# Patient Record
Sex: Female | Born: 1990 | Hispanic: No | Marital: Single | Smoking: Former smoker
Health system: Southern US, Community
[De-identification: ages and names within clinical notes are randomized; demographics above are authoritative.]

## PROBLEM LIST (undated history)

## (undated) DIAGNOSIS — I10 Essential (primary) hypertension: Secondary | ICD-10-CM

## (undated) DIAGNOSIS — D219 Benign neoplasm of connective and other soft tissue, unspecified: Secondary | ICD-10-CM

## (undated) HISTORY — DX: Benign neoplasm of connective and other soft tissue, unspecified: D21.9

## (undated) HISTORY — PX: BREAST SURGERY: SHX581

## (undated) HISTORY — PX: TONSILLECTOMY: SUR1361

## (undated) HISTORY — DX: Essential (primary) hypertension: I10

---
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
## 2021-03-20 ENCOUNTER — Encounter (HOSPITAL_COMMUNITY): Payer: Self-pay | Admitting: Emergency Medicine

## 2021-03-20 ENCOUNTER — Other Ambulatory Visit: Payer: Self-pay

## 2021-03-20 ENCOUNTER — Emergency Department (HOSPITAL_COMMUNITY)
Admission: EM | Admit: 2021-03-20 | Discharge: 2021-03-20 | Disposition: A | Discharge status: Home / Self Care | Payer: Medicaid Other | Attending: Emergency Medicine | Admitting: Emergency Medicine

## 2021-03-20 DIAGNOSIS — Z20822 Contact with and (suspected) exposure to covid-19: Secondary | ICD-10-CM | POA: Insufficient documentation

## 2021-03-20 DIAGNOSIS — R059 Cough, unspecified: Secondary | ICD-10-CM | POA: Diagnosis present

## 2021-03-20 DIAGNOSIS — J029 Acute pharyngitis, unspecified: Secondary | ICD-10-CM | POA: Diagnosis not present

## 2021-03-20 DIAGNOSIS — J069 Acute upper respiratory infection, unspecified: Secondary | ICD-10-CM | POA: Diagnosis not present

## 2021-03-20 LAB — GROUP A STREP BY PCR: Group A Strep by PCR: NOT DETECTED

## 2021-03-20 LAB — RESP PANEL BY RT-PCR (FLU A&B, COVID) ARPGX2
Influenza A by PCR: NEGATIVE
Influenza B by PCR: NEGATIVE
SARS Coronavirus 2 by RT PCR: NEGATIVE

## 2021-03-20 MED ORDER — IBUPROFEN 400 MG PO TABS
600.0000 mg | ORAL_TABLET | Freq: Once | ORAL | Status: AC
  Administered 2021-03-20: 600 mg via ORAL
  Filled 2021-03-20: qty 1

## 2021-03-20 NOTE — ED Provider Notes (Signed)
[place] EMERGENCY DEPARTMENT Provider Note   CSN: [id number] Arrival date & time: 03/20/21  0719     History Chief Complaint  Patient presents with  . Cough    Christina May is a 30 y.o. female.  Patient presents with hoarse cough since Saturday, and children with similar symptoms.  No fevers or chills.  No vomiting.  Tolerating oral liquids.  No active medical problems or lung disease.  Mild sore throat.        History reviewed. No pertinent past medical history.  There are no problems to display for this patient.   History reviewed. No pertinent surgical history.   OB History   No obstetric history on file.     No family history on file.  Social History   Tobacco Use  . Smoking status: Never Smoker  . Smokeless tobacco: Never Used    Home Medications Prior to Admission medications   Not on File    Allergies    Patient has no known allergies.  Review of Systems   Review of Systems  Constitutional: Negative for chills and fever.  HENT: Positive for congestion.   Eyes: Negative for visual disturbance.  Respiratory: Positive for cough. Negative for shortness of breath.   Cardiovascular: Negative for chest pain.  Gastrointestinal: Negative for abdominal pain and vomiting.  Genitourinary: Negative for flank pain.  Musculoskeletal: Negative for back pain, neck pain and neck stiffness.  Skin: Negative for rash.  Neurological: Negative for light-headedness and headaches.    Physical Exam Updated Vital Signs BP 126/83 (BP Location: Right Arm)   Pulse 89   Temp 97.7 F (36.5 C) (Temporal)   Resp 16   SpO2 99%   Physical Exam Vitals and nursing note reviewed.  Constitutional:      Appearance: She is well-developed.  HENT:     Head: Normocephalic and atraumatic.     Nose: Congestion and rhinorrhea present.     Mouth/Throat:     Pharynx: Posterior oropharyngeal erythema present. No oropharyngeal exudate.  Eyes:     General:         Right eye: No discharge.        Left eye: No discharge.     Conjunctiva/sclera: Conjunctivae normal.  Neck:     Trachea: No tracheal deviation.  Cardiovascular:     Rate and Rhythm: Normal rate and regular rhythm.  Pulmonary:     Effort: Pulmonary effort is normal.     Breath sounds: Normal breath sounds.  Abdominal:     General: There is no distension.     Palpations: Abdomen is soft.     Tenderness: There is no abdominal tenderness. There is no guarding.  Musculoskeletal:     Cervical back: Normal range of motion and neck supple. No rigidity.  Skin:    General: Skin is warm.     Findings: No rash.  Neurological:     General: No focal deficit present.     Mental Status: She is alert.  Psychiatric:        Mood and Affect: Mood normal.     ED Results / Procedures / Treatments   Labs (all labs ordered are listed, but only abnormal results are displayed) Labs Reviewed  GROUP A STREP BY PCR  RESP PANEL BY RT-PCR (FLU A&B, COVID) ARPGX2    EKG None  Radiology No results found.  Procedures Procedures   Medications Ordered in ED Medications  ibuprofen (ADVIL) tablet 600 mg (600 mg Oral  Given 03/20/21 0804)    ED Course  I have reviewed the triage vital signs and the nursing notes.  Pertinent labs & imaging results that were available during my care of the patient were reviewed by me and considered in my medical decision making (see chart for details).    MDM Rules/Calculators/A&P                          Patient presents with clinical concern for upper respiratory infection/viral infection however with persistent sore throat and children with similar symptoms plan add strep test to viral testing.  Supportive care and outpatient follow-up discussed.  Mother has children with her and will follow up results on MyChart. Patient has normal work of breathing, normal vital signs.  Christina May was evaluated in Emergency Department on 03/20/2021 for the symptoms  described in the history of present illness. She was evaluated in the context of the global COVID-19 pandemic, which necessitated consideration that the patient might be at risk for infection with the SARS-CoV-2 virus that causes COVID-19. Institutional protocols and algorithms that pertain to the evaluation of patients at risk for COVID-19 are in a state of rapid change based on information released by regulatory bodies including the CDC and federal and state organizations. These policies and algorithms were followed during the patient's care in the ED.  Final Clinical Impression(s) / ED Diagnoses Final diagnoses:  Acute pharyngitis, unspecified etiology  Acute upper respiratory infection    Rx / DC Orders ED Discharge Orders    None       Elnora Morrison, MD 03/20/21 [phone]

## 2021-03-20 NOTE — ED Triage Notes (Signed)
Deep cough since Saturday. No fever. Kids are sick as well.

## 2021-03-20 NOTE — Discharge Instructions (Signed)
Follow-up testing results on MyChart.  If strep test is positive you will either need the pediatric ER or your primary doctor to call in oral antibiotics.  If the strep test is negative still continue supportive care of ibuprofen, Tylenol and oral fluids.  Follow-up viral test results as well later this afternoon. Return for breathing difficulties or new concerns.

## 2021-03-22 ENCOUNTER — Other Ambulatory Visit: Payer: Self-pay

## 2021-03-22 ENCOUNTER — Emergency Department (HOSPITAL_COMMUNITY)
Admission: EM | Admit: 2021-03-22 | Discharge: 2021-03-22 | Disposition: A | Discharge status: Home / Self Care | Payer: Medicaid Other | Attending: Emergency Medicine | Admitting: Emergency Medicine

## 2021-03-22 ENCOUNTER — Encounter (HOSPITAL_COMMUNITY): Payer: Self-pay | Admitting: Emergency Medicine

## 2021-03-22 DIAGNOSIS — J029 Acute pharyngitis, unspecified: Secondary | ICD-10-CM | POA: Insufficient documentation

## 2021-03-22 DIAGNOSIS — K122 Cellulitis and abscess of mouth: Secondary | ICD-10-CM | POA: Insufficient documentation

## 2021-03-22 DIAGNOSIS — Z20822 Contact with and (suspected) exposure to covid-19: Secondary | ICD-10-CM | POA: Insufficient documentation

## 2021-03-22 LAB — RESP PANEL BY RT-PCR (FLU A&B, COVID) ARPGX2
Influenza A by PCR: NEGATIVE
Influenza B by PCR: NEGATIVE
SARS Coronavirus 2 by RT PCR: NEGATIVE

## 2021-03-22 LAB — GROUP A STREP BY PCR: Group A Strep by PCR: NOT DETECTED

## 2021-03-22 MED ORDER — CLINDAMYCIN HCL 150 MG PO CAPS: 300.0000 mg | ORAL_CAPSULE | Freq: Three times a day (TID) | ORAL | 0 refills | Status: DC

## 2021-03-22 NOTE — ED Provider Notes (Signed)
[place] EMERGENCY DEPARTMENT Provider Note   CSN: [id number] Arrival date & time: 03/22/21  0347     History Chief Complaint  Patient presents with   Sore Throat    Christina May is a 30 y.o. female.  Patient presents to the emergency department with a chief complaint of sore throat and cough.  Reports having had the symptoms for the past 3 to 4 days.  She states that she was seen here on Wednesday and had negative COVID and strep.  These tests were repeated in triage, and are negative again tonight.  Patient denies having had any fevers.  She reports that she is taking over-the-counter cough and cold medications with little relief.  She states that she has been tolerating fluids, but it is painful to swallow.  The history is provided by the patient. No language interpreter was used.      History reviewed. No pertinent past medical history.  There are no problems to display for this patient.   History reviewed. No pertinent surgical history.   OB History   No obstetric history on file.     History reviewed. No pertinent family history.  Social History   Tobacco Use   Smoking status: Never   Smokeless tobacco: Never    Home Medications Prior to Admission medications   Not on File    Allergies    Patient has no known allergies.  Review of Systems   Review of Systems  All other systems reviewed and are negative.  Physical Exam Updated Vital Signs BP 119/89 (BP Location: Left Arm)   Pulse 85   Temp 98.4 F (36.9 C) (Oral)   Resp 20   Ht 5\' 5"  (1.651 m)   Wt 91.2 kg   LMP 03/15/2021   SpO2 98%   BMI 33.45 kg/m   Physical Exam Vitals and nursing note reviewed.  Constitutional:      General: She is not in acute distress.    Appearance: She is well-developed.  HENT:     Head: Normocephalic and atraumatic.     Mouth/Throat:     Comments: Oropharynx mildly erythematous, uvula is erythematous and mildly edematous, no tonsillar  exudate or abscess, normal voice, no stridor Eyes:     Conjunctiva/sclera: Conjunctivae normal.  Cardiovascular:     Rate and Rhythm: Normal rate.     Heart sounds: No murmur heard. Pulmonary:     Effort: Pulmonary effort is normal. No respiratory distress.  Abdominal:     General: There is no distension.  Musculoskeletal:     Cervical back: Neck supple.     Comments: Moves all extremities  Skin:    General: Skin is warm and dry.  Neurological:     Mental Status: She is alert and oriented to person, place, and time.  Psychiatric:        Mood and Affect: Mood normal.        Behavior: Behavior normal.    ED Results / Procedures / Treatments   Labs (all labs ordered are listed, but only abnormal results are displayed) Labs Reviewed  GROUP A STREP BY PCR  RESP PANEL BY RT-PCR (FLU A&B, COVID) ARPGX2    EKG None  Radiology No results found.  Procedures Procedures   Medications Ordered in ED Medications - No data to display  ED Course  I have reviewed the triage vital signs and the nursing notes.  Pertinent labs & imaging results that were available during my care of  the patient were reviewed by me and considered in my medical decision making (see chart for details).    MDM Rules/Calculators/A&P                          Patient here with sore throat and cough.  COVID and strep test are negative.  There is a moderate amount of swelling and erythema about the uvula, consider uvulitis.  I discussed with the patient that her symptoms are likely viral, but given the worsening and persistent symptoms and swollen uvula, will trial clindamycin. Final Clinical Impression(s) / ED Diagnoses Final diagnoses:  Sore throat  Uvulitis    Rx / DC Orders ED Discharge Orders          Ordered    clindamycin (CLEOCIN) 150 MG capsule  3 times daily        03/22/21 0513             Montine Circle, PA-C 03/22/21 0513    Maudie Flakes, MD 03/22/21 [phone]

## 2021-03-22 NOTE — ED Notes (Signed)
Patient verbalizes understanding of discharge instructions. Prescriptions reviewed. Opportunity for questioning and answers were provided. Armband removed by staff, pt discharged from ED ambulatory.

## 2021-03-22 NOTE — ED Triage Notes (Signed)
Pt c/o sore throat x3 days, with painful swallowing along with bilateral ear aches. Has negative COVID/Strep test on Wednesday. Denies CP/SOB. Has been tolerating fluids.

## 2021-05-21 LAB — HEPATITIS C ANTIBODY: HCV Ab: NEGATIVE

## 2021-05-21 LAB — OB RESULTS CONSOLE RUBELLA ANTIBODY, IGM: Rubella: IMMUNE

## 2021-05-21 LAB — OB RESULTS CONSOLE HIV ANTIBODY (ROUTINE TESTING): HIV: NONREACTIVE

## 2021-05-21 LAB — OB RESULTS CONSOLE HEPATITIS B SURFACE ANTIGEN: Hepatitis B Surface Ag: NEGATIVE

## 2021-10-01 ENCOUNTER — Ambulatory Visit (INDEPENDENT_AMBULATORY_CARE_PROVIDER_SITE_OTHER): Payer: Medicaid Other | Admitting: Student

## 2021-10-01 ENCOUNTER — Encounter: Payer: Self-pay | Admitting: Student

## 2021-10-01 ENCOUNTER — Other Ambulatory Visit: Payer: Self-pay

## 2021-10-01 VITALS — BP 107/81 | HR 94 | Wt 191.9 lb

## 2021-10-01 DIAGNOSIS — O43113 Circumvallate placenta, third trimester: Secondary | ICD-10-CM | POA: Diagnosis not present

## 2021-10-01 DIAGNOSIS — Z8759 Personal history of other complications of pregnancy, childbirth and the puerperium: Secondary | ICD-10-CM | POA: Insufficient documentation

## 2021-10-01 DIAGNOSIS — Z3A33 33 weeks gestation of pregnancy: Secondary | ICD-10-CM | POA: Diagnosis not present

## 2021-10-01 DIAGNOSIS — Z3483 Encounter for supervision of other normal pregnancy, third trimester: Secondary | ICD-10-CM

## 2021-10-01 DIAGNOSIS — Z3490 Encounter for supervision of normal pregnancy, unspecified, unspecified trimester: Secondary | ICD-10-CM | POA: Insufficient documentation

## 2021-10-01 DIAGNOSIS — Z3493 Encounter for supervision of normal pregnancy, unspecified, third trimester: Secondary | ICD-10-CM

## 2021-10-01 NOTE — Progress Notes (Signed)
°  Subjective:    Christina May is being seen today for her first obstetrical visit.  This is not a planned pregnancy. She is at [redacted]w[redacted]d gestation. Her obstetrical history is significant for  history of high blood pressure in pregnancy. She says it was only elevated in pregnancy and they induced her for this. She denies  that she had pre-eclampsia. She denies any history of c/sections.  . Relationship with FOB: significant other, not living together.  He is living in [place]; may move up here they are not sure. Patient does intend to breast feed. Pregnancy history fully reviewed.  Patient reports no complaints.   Patient states that her OB was concerned about her placenta; concerned about the location of it. Otherwise she says that her pregnancy has been uncomplicated.   Review of Systems:   Review of Systems  Constitutional: Negative.   HENT: Negative.    Eyes: Negative.   Respiratory: Negative.    Cardiovascular: Negative.   Gastrointestinal: Negative.   Genitourinary: Negative.   Neurological: Negative.   Hematological: Negative.   Psychiatric/Behavioral: Negative.     Objective:     BP 107/81    Pulse 94    Wt 191 lb 14.4 oz (87 kg)    LMP 02/12/2021 (Exact Date)    BMI 31.93 kg/m  Physical Exam Constitutional:      Appearance: Normal appearance.  HENT:     Head: Normocephalic.  Cardiovascular:     Rate and Rhythm: Normal rate.  Pulmonary:     Effort: Pulmonary effort is normal.  Abdominal:     General: Abdomen is flat.  Musculoskeletal:        General: Normal range of motion.  Skin:    General: Skin is warm.     Capillary Refill: Capillary refill takes less than 2 seconds.  Neurological:     General: No focal deficit present.     Mental Status: She is alert.    Exam    Assessment:    Pregnancy: [id number] Patient Active Problem List   Diagnosis Date Noted   Supervision of low-risk pregnancy 10/01/2021       Plan:     Initial labs drawn. Prenatal  vitamins. Problem list reviewed and updated. AFP3 discussed:  too late . Role of ultrasound in pregnancy discussed; fetal survey: ordered. Amniocentesis discussed: not indicated. Follow up in 2 weeks. 75% of 30 min visit spent on counseling and coordination of care.  -planning for BTL; signed papers today -patient has social issues; she is unable to miss work starting January 23. The stress of losing her job is overwhelming to her. She is requesting IOL at 39 weeks on a Friday due to work. I explained that that we will work with her to make this happen as losing her job would be very detrimental to her health and well-being and that has multip this would be possible.  -reviewed labs from past appts; all normal -will draw baseline pre-e labs today -unable to review [place] in Epic, note says that placenta was circumvellate. Will order [place] today to check on placenta -welcomed patient to practice and discussed how the practice works; patient i Christina May 10/01/2021

## 2021-10-02 ENCOUNTER — Other Ambulatory Visit: Payer: Self-pay

## 2021-10-02 DIAGNOSIS — O43113 Circumvallate placenta, third trimester: Secondary | ICD-10-CM

## 2021-10-02 DIAGNOSIS — O43119 Circumvallate placenta, unspecified trimester: Secondary | ICD-10-CM | POA: Insufficient documentation

## 2021-10-02 LAB — PROTEIN / CREATININE RATIO, URINE
Creatinine, Urine: 141.4 mg/dL
Protein, Ur: 17.8 mg/dL
Protein/Creat Ratio: 126 mg/g creat (ref 0–200)

## 2021-10-02 LAB — COMPREHENSIVE METABOLIC PANEL
ALT: 7 IU/L (ref 0–32)
AST: 16 IU/L (ref 0–40)
Albumin/Globulin Ratio: 1 — ABNORMAL LOW (ref 1.2–2.2)
Albumin: 3.4 g/dL — ABNORMAL LOW (ref 3.9–5.0)
Alkaline Phosphatase: 210 IU/L — ABNORMAL HIGH (ref 44–121)
BUN/Creatinine Ratio: 13 (ref 9–23)
BUN: 5 mg/dL — ABNORMAL LOW (ref 6–20)
Bilirubin Total: 0.2 mg/dL (ref 0.0–1.2)
CO2: 20 mmol/L (ref 20–29)
Calcium: 9 mg/dL (ref 8.7–10.2)
Chloride: 104 mmol/L (ref 96–106)
Creatinine, Ser: 0.39 mg/dL — ABNORMAL LOW (ref 0.57–1.00)
Globulin, Total: 3.4 g/dL (ref 1.5–4.5)
Glucose: 88 mg/dL (ref 70–99)
Potassium: 4.2 mmol/L (ref 3.5–5.2)
Sodium: 136 mmol/L (ref 134–144)
Total Protein: 6.8 g/dL (ref 6.0–8.5)
eGFR: 139 mL/min/{1.73_m2} (ref >59)

## 2021-10-02 LAB — CBC
Hematocrit: 33.3 % — ABNORMAL LOW (ref 34.0–46.6)
Hemoglobin: 10.6 g/dL — ABNORMAL LOW (ref 11.1–15.9)
MCH: 24.1 pg — ABNORMAL LOW (ref 26.6–33.0)
MCHC: 31.8 g/dL (ref 31.5–35.7)
MCV: 76 fL — ABNORMAL LOW (ref 79–97)
Platelets: 160 10*3/uL (ref 150–450)
RBC: 4.39 x10E6/uL (ref 3.77–5.28)
RDW: 13.8 % (ref 11.7–15.4)
WBC: 11.9 10*3/uL — ABNORMAL HIGH (ref 3.4–10.8)

## 2021-10-09 ENCOUNTER — Ambulatory Visit: Payer: Medicaid Other | Admitting: *Deleted

## 2021-10-09 ENCOUNTER — Ambulatory Visit: Payer: Medicaid Other | Attending: Student

## 2021-10-09 ENCOUNTER — Other Ambulatory Visit: Payer: Self-pay

## 2021-10-09 ENCOUNTER — Encounter: Payer: Self-pay | Admitting: *Deleted

## 2021-10-09 VITALS — BP 123/80 | HR 93

## 2021-10-09 DIAGNOSIS — Z3493 Encounter for supervision of normal pregnancy, unspecified, third trimester: Secondary | ICD-10-CM | POA: Diagnosis present

## 2021-10-09 DIAGNOSIS — E669 Obesity, unspecified: Secondary | ICD-10-CM | POA: Diagnosis not present

## 2021-10-09 DIAGNOSIS — O99213 Obesity complicating pregnancy, third trimester: Secondary | ICD-10-CM

## 2021-10-09 DIAGNOSIS — O43103 Malformation of placenta, unspecified, third trimester: Secondary | ICD-10-CM | POA: Diagnosis not present

## 2021-10-09 DIAGNOSIS — O09293 Supervision of pregnancy with other poor reproductive or obstetric history, third trimester: Secondary | ICD-10-CM

## 2021-10-09 DIAGNOSIS — O43113 Circumvallate placenta, third trimester: Secondary | ICD-10-CM | POA: Diagnosis present

## 2021-10-09 DIAGNOSIS — Z3A34 34 weeks gestation of pregnancy: Secondary | ICD-10-CM | POA: Insufficient documentation

## 2021-10-13 ENCOUNTER — Encounter: Payer: Self-pay | Admitting: Student

## 2021-10-13 NOTE — L&D Delivery Note (Signed)
OB/GYN Faculty Practice Delivery Note  Christina May is a 31 y.o. [id number] s/p SVD at [redacted]w[redacted]d. She was admitted for IOL for gHTN.   ROM: 1h 23m with clear fluid GBS Status: negative Maximum Maternal Temperature: 98.8  Labor Progress: Presented for IOL, was 4 cm, started on pitocin and then 4 hours later was still 4 cm and AROMed and progressed to complete within an hour.  Delivery Date/Time: [phone] on 11/02/2021 Delivery: Called to room - patient felt intense rectal pressure with RN at bedside and her legs were lifted up baby's head was out. Head delivered LOA. Nuchal cord present and delivered through with shoulder and body delivered in usual fashion. Nuchal cord reduced after delivery. Infant with spontaneous cry, placed on mother's abdomen, dried and stimulated. Cord clamped x 2 after 1-minute delay, and cut by father of baby. Cord blood drawn. Placenta delivered spontaneously with gentle cord traction. Fundus firm with massage and Pitocin. Labia, perineum, vagina, and cervix inspected inspected and found to have bilateral periurethral lacerations that were repaired with 4-0 Monocryl in interrupted fashion.   Placenta: intact, 3V cord, L&D Complications: none Lacerations: periurethral EBL: 100cc Analgesia: none  Infant: female   APGARs 8,9   weight pending  Renard Matter, MD, MPH OB Fellow, [place] for [facility], [facility] 11/02/2021, 5:34 AM

## 2021-10-25 ENCOUNTER — Other Ambulatory Visit: Payer: Self-pay

## 2021-10-25 ENCOUNTER — Other Ambulatory Visit (HOSPITAL_COMMUNITY)
Admission: RE | Admit: 2021-10-25 | Discharge: 2021-10-25 | Disposition: A | Discharge status: Home / Self Care | Payer: Medicaid Other | Source: Ambulatory Visit

## 2021-10-25 ENCOUNTER — Ambulatory Visit (INDEPENDENT_AMBULATORY_CARE_PROVIDER_SITE_OTHER): Payer: Medicaid Other

## 2021-10-25 VITALS — BP 123/82 | HR 94 | Wt 198.7 lb

## 2021-10-25 DIAGNOSIS — Z8669 Personal history of other diseases of the nervous system and sense organs: Secondary | ICD-10-CM | POA: Diagnosis not present

## 2021-10-25 DIAGNOSIS — Z3A36 36 weeks gestation of pregnancy: Secondary | ICD-10-CM | POA: Diagnosis not present

## 2021-10-25 DIAGNOSIS — Z3490 Encounter for supervision of normal pregnancy, unspecified, unspecified trimester: Secondary | ICD-10-CM | POA: Diagnosis present

## 2021-10-25 LAB — OB RESULTS CONSOLE GC/CHLAMYDIA: Gonorrhea: NEGATIVE

## 2021-10-25 MED ORDER — BLOOD PRESSURE KIT DEVI: 1.0000 | Freq: Once | 0 refills | Status: AC

## 2021-10-25 MED ORDER — CYCLOBENZAPRINE HCL 10 MG PO TABS: 10.0000 mg | ORAL_TABLET | Freq: Three times a day (TID) | ORAL | 1 refills | Status: DC | PRN

## 2021-10-25 NOTE — Progress Notes (Signed)
° °  PRENATAL VISIT NOTE  Subjective:  Christina May is a 31 y.o. [phone] at 59w3dbeing seen today for ongoing prenatal care.  She is currently monitored for the following issues for this low-risk pregnancy and has Supervision of low-risk pregnancy; History of gestational hypertension; and Circumvallate placenta on their problem list.  Patient reports irregular contractions that started last night. Denies vaginal bleeding or leaking fluid. Endorses active fetal movement. Also reports headache that started yesterday. Took Tylenol but did not help. Has history of migraines, but has not been on any medication for migraines since prior to pregnancy. Pain is similar to migraines. Denies vision changes or RUQ pain. Reports she has not had much to eat/drink recently due to decreased appetite. Contractions: Irritability. Vag. Bleeding: None.  Movement: Present. Denies leaking of fluid.   The following portions of the patient's history were reviewed and updated as appropriate: allergies, current medications, past family history, past medical history, past social history, past surgical history and problem list.   Objective:   Vitals:   10/25/21 1011  BP: 123/82  Pulse: 94  Weight: 198 lb 11.2 oz (90.1 kg)    Fetal Status: Fetal Heart Rate (bpm): 132 Fundal Height: 37 cm Movement: Present     General:  Alert, oriented and cooperative. Patient is in no acute distress.  Skin: Skin is warm and dry. No rash noted.   Cardiovascular: Normal heart rate noted  Respiratory: Normal respiratory effort, no problems with respiration noted  Abdomen: Soft, gravid, appropriate for gestational age.  Pain/Pressure: Present     Pelvic: Cervical exam performed in the presence of a chaperone Dilation: 2.5 Effacement (%): Thick Station: Ballotable  Extremities: Normal range of motion.  Edema: None  Mental Status: Normal mood and affect. Normal behavior. Normal judgment and thought content.   Assessment and Plan:   Pregnancy: [id number] 379w3d. Encounter for supervision of low-risk pregnancy, antepartum - Routine OB care  - Cervical exam per patient request - Anticipatory guidance for upcoming appointments provided - Patient desires IOL on 2/4 due to work schedule. Starting a new job on 1/23 and cannot miss work. Work schedule will be Tuesday-Saturday 10a-6:30p. Will discuss more at next visit  - Blood Pressure Monitoring (BLOOD PRESSURE KIT) DEVI; 1 Device by Does not apply route once for 1 dose.  Dispense: 1 each; Refill: 0 - Cervicovaginal ancillary only( [place]) - Culture, beta strep (group b only)  2. Hx of migraines - BP normotensive. Will rx flexeril  - Encouraged patient to eat/drink in small, frequent amounts. May use Tylenol on scheduled basis.  - Strict return precautions reviewed at length  - cyclobenzaprine (FLEXERIL) 10 MG tablet; Take 1 tablet (10 mg total) by mouth every 8 (eight) hours as needed for muscle spasms.  Dispense: 30 tablet; Refill: 1  3. [redacted] weeks gestation of pregnancy   Preterm labor symptoms and general obstetric precautions including but not limited to vaginal bleeding, contractions, leaking of fluid and fetal movement were reviewed in detail with the patient. Please refer to After Visit Summary for other counseling recommendations.   Return in about 1 week (around 11/01/2021).  Future Appointments  Date Time Provider [place]1/20/2023  8:55 AM DuRadene GunningMD [facility] 10/25/21 11:35 AM

## 2021-10-25 NOTE — Progress Notes (Deleted)
° °  PRENATAL VISIT NOTE  Subjective:  Christina May is a 31 y.o. [phone] at 31w3dbeing seen today for ongoing prenatal care.  She is currently monitored for the following issues for this low-risk pregnancy and has Supervision of low-risk pregnancy; History of gestational hypertension; and Circumvallate placenta on their problem list.  Patient reports {[id number]}.  Contractions: Irritability. Vag. Bleeding: None.  Movement: Present. Denies leaking of fluid.   The following portions of the patient's history were reviewed and updated as appropriate: allergies, current medications, past family history, past medical history, past social history, past surgical history and problem list.   Objective:   Vitals:   10/25/21 1011  BP: 123/82  Pulse: 94  Weight: 198 lb 11.2 oz (90.1 kg)    Fetal Status: Fetal Heart Rate (bpm): 132 Fundal Height: 37 cm Movement: Present     General:  Alert, oriented and cooperative. Patient is in no acute distress.  Skin: Skin is warm and dry. No rash noted.   Cardiovascular: Normal heart rate noted  Respiratory: Normal respiratory effort, no problems with respiration noted  Abdomen: Soft, gravid, appropriate for gestational age.  Pain/Pressure: Present     Pelvic: Cervical exam performed in the presence of a chaperone Dilation: 2.5 Effacement (%): Thick Station: Ballotable  Extremities: Normal range of motion.  Edema: None  Mental Status: Normal mood and affect. Normal behavior. Normal judgment and thought content.   Assessment and Plan:  Pregnancy: [id number] 362w3d. Encounter for supervision of low-risk pregnancy, antepartum *** - Blood Pressure Monitoring (BLOOD PRESSURE KIT) DEVI; 1 Device by Does not apply route once for 1 dose.  Dispense: 1 each; Refill: 0 - Cervicovaginal ancillary only( [place]) - Culture, beta strep (group b only)  2. Hx of migraines *** - cyclobenzaprine (FLEXERIL) 10 MG tablet; Take 1 tablet (10 mg total) by mouth every 8  (eight) hours as needed for muscle spasms.  Dispense: 30 tablet; Refill: 1  3. [redacted] weeks gestation of pregnancy ***    Preterm labor symptoms and general obstetric precautions including but not limited to vaginal bleeding, contractions, leaking of fluid and fetal movement were reviewed in detail with the patient. Please refer to After Visit Summary for other counseling recommendations.   Return in about 1 week (around 11/01/2021).  No future appointments.  DaRenee HarderCNM

## 2021-10-28 LAB — CERVICOVAGINAL ANCILLARY ONLY
Chlamydia: NEGATIVE
Comment: NEGATIVE
Comment: NORMAL
Neisseria Gonorrhea: NEGATIVE

## 2021-10-29 LAB — CULTURE, BETA STREP (GROUP B ONLY): Strep Gp B Culture: NEGATIVE

## 2021-10-31 ENCOUNTER — Other Ambulatory Visit: Payer: Self-pay

## 2021-10-31 ENCOUNTER — Encounter (HOSPITAL_COMMUNITY): Payer: Self-pay | Admitting: Obstetrics & Gynecology

## 2021-10-31 ENCOUNTER — Inpatient Hospital Stay (EMERGENCY_DEPARTMENT_HOSPITAL)
Admission: EM | Admit: 2021-10-31 | Discharge: 2021-10-31 | Disposition: A | Discharge status: Home / Self Care | Payer: Medicaid Other | Source: Home / Self Care | Admitting: Obstetrics & Gynecology

## 2021-10-31 ENCOUNTER — Inpatient Hospital Stay (EMERGENCY_DEPARTMENT_HOSPITAL)
Admission: AD | Admit: 2021-10-31 | Discharge: 2021-11-01 | Disposition: A | Discharge status: Home / Self Care | Payer: Medicaid Other | Source: Home / Self Care | Attending: Obstetrics & Gynecology | Admitting: Obstetrics & Gynecology

## 2021-10-31 DIAGNOSIS — Z20822 Contact with and (suspected) exposure to covid-19: Secondary | ICD-10-CM | POA: Insufficient documentation

## 2021-10-31 DIAGNOSIS — Z0371 Encounter for suspected problem with amniotic cavity and membrane ruled out: Secondary | ICD-10-CM

## 2021-10-31 DIAGNOSIS — Z8759 Personal history of other complications of pregnancy, childbirth and the puerperium: Secondary | ICD-10-CM | POA: Diagnosis not present

## 2021-10-31 DIAGNOSIS — O26893 Other specified pregnancy related conditions, third trimester: Secondary | ICD-10-CM | POA: Insufficient documentation

## 2021-10-31 DIAGNOSIS — R030 Elevated blood-pressure reading, without diagnosis of hypertension: Secondary | ICD-10-CM

## 2021-10-31 DIAGNOSIS — Z3480 Encounter for supervision of other normal pregnancy, unspecified trimester: Secondary | ICD-10-CM

## 2021-10-31 DIAGNOSIS — O43113 Circumvallate placenta, third trimester: Secondary | ICD-10-CM | POA: Diagnosis not present

## 2021-10-31 DIAGNOSIS — Z3A37 37 weeks gestation of pregnancy: Secondary | ICD-10-CM | POA: Insufficient documentation

## 2021-10-31 DIAGNOSIS — O471 False labor at or after 37 completed weeks of gestation: Secondary | ICD-10-CM | POA: Insufficient documentation

## 2021-10-31 LAB — COMPREHENSIVE METABOLIC PANEL
ALT: 10 U/L (ref 0–44)
AST: 17 U/L (ref 15–41)
Albumin: 2.5 g/dL — ABNORMAL LOW (ref 3.5–5.0)
Alkaline Phosphatase: 197 U/L — ABNORMAL HIGH (ref 38–126)
Anion gap: 8 (ref 5–15)
BUN: <5 mg/dL — ABNORMAL LOW (ref 6–20)
CO2: 21 mmol/L — ABNORMAL LOW (ref 22–32)
Calcium: 8.6 mg/dL — ABNORMAL LOW (ref 8.9–10.3)
Chloride: 108 mmol/L (ref 98–111)
Creatinine, Ser: 0.5 mg/dL (ref 0.44–1.00)
GFR, Estimated: >60 mL/min (ref >60)
Glucose, Bld: 107 mg/dL — ABNORMAL HIGH (ref 70–99)
Potassium: 3.9 mmol/L (ref 3.5–5.1)
Sodium: 137 mmol/L (ref 135–145)
Total Bilirubin: 0.3 mg/dL (ref 0.3–1.2)
Total Protein: 6.6 g/dL (ref 6.5–8.1)

## 2021-10-31 LAB — PROTEIN / CREATININE RATIO, URINE
Creatinine, Urine: 54.05 mg/dL
Total Protein, Urine: <6 mg/dL

## 2021-10-31 LAB — CBC
HCT: 31.8 % — ABNORMAL LOW (ref 36.0–46.0)
HCT: 33 % — ABNORMAL LOW (ref 36.0–46.0)
Hemoglobin: 9.7 g/dL — ABNORMAL LOW (ref 12.0–15.0)
Hemoglobin: 10.3 g/dL — ABNORMAL LOW (ref 12.0–15.0)
MCH: 23 pg — ABNORMAL LOW (ref 26.0–34.0)
MCH: 23.5 pg — ABNORMAL LOW (ref 26.0–34.0)
MCHC: 30.5 g/dL (ref 30.0–36.0)
MCHC: 31.2 g/dL (ref 30.0–36.0)
MCV: 75.5 fL — ABNORMAL LOW (ref 80.0–100.0)
MCV: 75.2 fL — ABNORMAL LOW (ref 80.0–100.0)
Platelets: 161 10*3/uL (ref 150–400)
Platelets: 179 10*3/uL (ref 150–400)
RBC: 4.21 MIL/uL (ref 3.87–5.11)
RBC: 4.39 MIL/uL (ref 3.87–5.11)
RDW: 15.1 % (ref 11.5–15.5)
RDW: 14.9 % (ref 11.5–15.5)
WBC: 13.1 10*3/uL — ABNORMAL HIGH (ref 4.0–10.5)
WBC: 15.3 10*3/uL — ABNORMAL HIGH (ref 4.0–10.5)
nRBC: 0 % (ref 0.0–0.2)
nRBC: 0 % (ref 0.0–0.2)

## 2021-10-31 LAB — RESP PANEL BY RT-PCR (FLU A&B, COVID) ARPGX2
Influenza A by PCR: NEGATIVE
Influenza B by PCR: NEGATIVE
SARS Coronavirus 2 by RT PCR: NEGATIVE

## 2021-10-31 LAB — TYPE AND SCREEN
ABO/RH(D): A POS
Antibody Screen: NEGATIVE

## 2021-10-31 LAB — AMNISURE RUPTURE OF MEMBRANE (ROM) NOT AT ARMC: Amnisure ROM: NEGATIVE

## 2021-10-31 LAB — POCT FERN TEST: POCT Fern Test: NEGATIVE

## 2021-10-31 MED ORDER — LACTATED RINGERS IV SOLN: INTRAVENOUS | Status: DC

## 2021-10-31 NOTE — ED Provider Notes (Signed)
Emergency Medicine Provider OB Triage Evaluation Note  Christina May is a 31 y.o. female, [id number], at [redacted]w[redacted]d gestation who presents to the emergency department with complaints of contractions at [redacted] weeks gestation. Onset last night, reports 3 contractions in 20 min drive to the ER. No bleeding, not leaking fluids. Does have prenatal care.  Review of  Systems  Positive: contractions Negative: bleeding, leaking fluids  Physical Exam  BP 127/88 (BP Location: Left Arm)    Pulse (!) 108    Temp 98.4 F (36.9 C) (Oral)    Resp 18    LMP 02/12/2021 (Exact Date)    SpO2 100%  General: Awake, no distress  HEENT: Atraumatic  Resp: Normal effort  Cardiac: Normal rate Abd: gravid MSK: Moves all extremities without difficulty Neuro: Speech clear  Medical Decision Making  Pt evaluated for pregnancy concern and is stable for transfer to [facility]. Pt is in agreement with plan for transfer.  9:09 AM Discussed with MAU Attending MD, who accepts patient in transfer.  Clinical Impression  No diagnosis found.     Tacy Learn, PA-C 10/31/21 7741    Carmin Muskrat, MD 10/31/21 [phone]

## 2021-10-31 NOTE — MAU Provider Note (Signed)
History     [id number]  Arrival date and time: 10/31/21 0855    Chief Complaint  Patient presents with   Contractions   Abdominal Pain     HPI Christina May is a 31 y.o. 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   Patient reports consistent contractions since last night No leaking fluid No bleeding prior to getting here, but is having some mild bloody discharge now Normal fetal movement Denies headache, vision changes, chest pain, shortness of breath, RUQ pain, LE edema     OB History     Gravida  5   Para  3   Term  3   Preterm      AB  1   Living  3      SAB  1   IAB      Ectopic      Multiple      Live Births  3           Past Medical History:  Diagnosis Date   Fibroid    Hypertension     Past Surgical History:  Procedure Laterality Date   BREAST SURGERY     TONSILLECTOMY      Family History  Problem Relation Age of Onset   Thyroid disease Mother    Cancer Sister    Hypertension Maternal Grandmother    Diabetes Maternal Grandmother    Cancer Maternal Grandfather    Hypertension Paternal Grandmother    Diabetes Paternal Grandmother    Hypertension Paternal Grandfather    Diabetes Paternal Grandfather     Social History   Socioeconomic History   Marital status: Single    Spouse name: Not on file   Number of children: Not on file   Years of education: Not on file   Highest education level: Not on file  Occupational History   Not on file  Tobacco Use   Smoking status: Former    Types: Cigarettes   Smokeless tobacco: Never  Vaping Use   Vaping Use: Never used  Substance and Sexual Activity   Alcohol use: Not Currently   Drug use: Never   Sexual activity: Yes  Other Topics Concern   Not on file  Social History Narrative   Not on file   Social Determinants of Health   Financial Resource Strain: Not on file  Food Insecurity:  No Food Insecurity   Worried About Running Out of Food in the Last Year: Never true   Ran Out of Food in the Last Year: Never true  Transportation Needs: No Transportation Needs   Lack of Transportation (Medical): No   Lack of Transportation (Non-Medical): No  Physical Activity: Not on file  Stress: Not on file  Social Connections: Not on file  Intimate Partner Violence: Not on file    No Known Allergies  No current facility-administered medications on file prior to encounter.   Current Outpatient Medications on File Prior to Encounter  Medication Sig Dispense Refill   Prenatal Vit-Fe Fumarate-FA (PRENATAL MULTIVITAMIN) TABS tablet Take 1 tablet by mouth daily at 12 noon.     cyclobenzaprine (FLEXERIL) 10 MG tablet Take 1 tablet (10 mg total) by mouth every 8 (eight) hours as needed for muscle spasms. 30 tablet 1     ROS Pertinent positives and negative per HPI, all others reviewed and negative  Physical Exam   BP 118/79 (BP Location: Right Arm)  Pulse 97    Temp 97.6 F (36.4 C) (Oral)    Resp 20    Ht 5\' 5"  (1.651 m)    Wt 89.4 kg    LMP 02/12/2021 (Exact Date)    SpO2 99%    BMI 32.82 kg/m   Patient Vitals for the past 24 hrs:  BP Temp Temp src Pulse Resp SpO2 Height Weight  10/31/21 1200 118/79 -- -- 97 20 99 % -- --  10/31/21 1145 101/78 -- -- (!) 102 -- 100 % -- --  10/31/21 1130 123/76 -- -- 94 -- 100 % -- --  10/31/21 1116 129/82 -- -- (!) 101 -- -- -- --  10/31/21 1100 (!) 139/95 -- -- 72 -- -- -- --  10/31/21 1046 (!) 135/92 -- -- 97 -- -- -- --  10/31/21 1001 118/83 -- -- 100 18 -- -- --  10/31/21 1000 -- -- -- -- -- 100 % -- --  10/31/21 0946 123/81 -- -- (!) 102 -- 99 % -- --  10/31/21 0938 (!) 149/84 -- -- 99 -- -- -- --  10/31/21 0921 134/87 97.6 F (36.4 C) Oral (!) 109 20 100 % -- --  10/31/21 0918 -- -- -- -- -- -- 5\' 5"  (1.651 m) 89.4 kg  10/31/21 0858 127/88 98.4 F (36.9 C) Oral (!) 108 18 100 % -- --    Physical Exam Vitals reviewed.   Constitutional:      General: She is not in acute distress.    Appearance: She is well-developed. She is not diaphoretic.  Eyes:     General: No scleral icterus. Pulmonary:     Effort: Pulmonary effort is normal. No respiratory distress.  Skin:    General: Skin is warm and dry.  Neurological:     Mental Status: She is alert.     Coordination: Coordination normal.     Cervical Exam Dilation: 4 Effacement (%): 70 Station: -2 Presentation: Vertex Exam by:: Truitt Leep, RNC  Bedside Ultrasound Not done  My interpretation: n/a  FHT Baseline 135, moderate variability, +accels, no decels Toco: irregular contractions, 5-8 min apart Cat: I  Labs Results for orders placed or performed during the hospital encounter of 10/31/21 (from the past 24 hour(s))  CBC     Status: Abnormal   Collection Time: 10/31/21 10:03 AM  Result Value Ref Range   WBC 13.1 (H) 4.0 - 10.5 K/uL   RBC 4.21 3.87 - 5.11 MIL/uL   Hemoglobin 9.7 (L) 12.0 - 15.0 g/dL   HCT 31.8 (L) 36.0 - 46.0 %   MCV 75.5 (L) 80.0 - 100.0 fL   MCH 23.0 (L) 26.0 - 34.0 pg   MCHC 30.5 30.0 - 36.0 g/dL   RDW 15.1 11.5 - 15.5 %   Platelets 161 150 - 400 K/uL   nRBC 0.0 0.0 - 0.2 %  Comprehensive metabolic panel     Status: Abnormal   Collection Time: 10/31/21 10:03 AM  Result Value Ref Range   Sodium 137 135 - 145 mmol/L   Potassium 3.9 3.5 - 5.1 mmol/L   Chloride 108 98 - 111 mmol/L   CO2 21 (L) 22 - 32 mmol/L   Glucose, Bld 107 (H) 70 - 99 mg/dL   BUN <5 (L) 6 - 20 mg/dL   Creatinine, Ser 0.50 0.44 - 1.00 mg/dL   Calcium 8.6 (L) 8.9 - 10.3 mg/dL   Total Protein 6.6 6.5 - 8.1 g/dL   Albumin 2.5 (L) 3.5 - 5.0 g/dL  AST 17 15 - 41 U/L   ALT 10 0 - 44 U/L   Alkaline Phosphatase 197 (H) 38 - 126 U/L   Total Bilirubin 0.3 0.3 - 1.2 mg/dL   GFR, Estimated >60 >60 mL/min   Anion gap 8 5 - 15  Protein / creatinine ratio, urine     Status: None   Collection Time: 10/31/21 11:17 AM  Result Value Ref Range   Creatinine,  Urine 54.05 mg/dL   Total Protein, Urine <6 mg/dL   Protein Creatinine Ratio        0.00 - 0.15 mg/mg[Cre]    Imaging No results found.  MAU Course  Procedures Lab Orders         CBC         Comprehensive metabolic panel         Protein / creatinine ratio, urine    No orders of the defined types were placed in this encounter.  Imaging Orders  No imaging studies ordered today    MDM moderate  Assessment and Plan  #Elevated BP without diagnosis of hypertension #History of gestational hypertension #[redacted] weeks gestation of pregnancy Pre E labs unremarkable. Has follow up tomorrow at [facility]. Discussed PreE symptoms and warning signs.   #False labor Cervix unchanged over serial exams  #FWB FHT Cat I NST: Reactive   Dispo: discharged to home in stable condition.   Clarnce Flock, MD/MPH 10/31/21 12:40 PM  Allergies as of 10/31/2021   No Known Allergies      Medication List     TAKE these medications    cyclobenzaprine 10 MG tablet Commonly known as: FLEXERIL Take 1 tablet (10 mg total) by mouth every 8 (eight) hours as needed for muscle spasms.   prenatal multivitamin Tabs tablet Take 1 tablet by mouth daily at 12 noon.

## 2021-10-31 NOTE — MAU Note (Signed)
Dr Cy Blamer called back to MAU from [facility]. Aware of pt's status with negative amnisure. Dr Cy Blamer will discuss with Dr Roselie Awkward and then come see pt. OK to d/c EFM

## 2021-10-31 NOTE — ED Notes (Signed)
Called MAU , Given report to [place], Therapist, sports.   Chris EMT transported to MAU

## 2021-10-31 NOTE — Progress Notes (Signed)
° °  OBSTETRICS PRENATAL VIRTUAL VISIT ENCOUNTER NOTE  Provider location: [facility] [place] [facility]   Patient location: Home  I connected with Jamirra Curnow on 11/01/21 at  8:55 AM EST by MyChart Video Encounter and verified that I am speaking with the correct person using two identifiers. I discussed the limitations, risks, security and privacy concerns of performing an evaluation and management service virtually and the availability of in person appointments. I also discussed with the patient that there may be a patient responsible charge related to this service. The patient expressed understanding and agreed to proceed.  Subjective:  Christina May is a 31 y.o. [phone] at [redacted]w[redacted]d being seen today for ongoing prenatal care.  She is currently monitored for the following issues for this low-risk pregnancy and has Supervision of low-risk pregnancy; History of gestational hypertension; and Circumvallate placenta on their problem list.  Patient reports  irregular contractions .  Contractions: Irregular. Vag. Bleeding: Bloody Show.  Movement: Present. Denies any leaking of fluid.   The following portions of the patient's history were reviewed and updated as appropriate: allergies, current medications, past family history, past medical history, past social history, past surgical history and problem list.   Objective:   Vitals:   11/01/21 0853  BP: (!) 135/93  Pulse: 99  BP Recheck was 128/89  Fetal Status:     Movement: Present     General:  Alert, oriented and cooperative. Patient is in no acute distress.  Respiratory: Normal respiratory effort, no problems with respiration noted  Mental Status: Normal mood and affect. Normal behavior. Normal judgment and thought content.  Rest of physical exam deferred due to type of encounter  Assessment and Plan:  Pregnancy: [id number] at [redacted]w[redacted]d 1. Encounter for supervision of low-risk pregnancy in third trimester GBS neg  2.  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 History of gestational hypertension - Mild range Bps in [place] labs normal and protein too low to calc - Scheduled for IOL at 39w, but sending to MAU for HA. She will take some tylenol and drink some fluids and then head to the MAU. MAU notified.    Term labor symptoms and general obstetric precautions including but not limited to vaginal bleeding, contractions, leaking of fluid and fetal movement were reviewed in detail with the patient. I discussed the assessment and treatment plan with the patient. The patient was provided an opportunity to ask questions and all were answered. The patient agreed with the plan and demonstrated an understanding of the instructions. The patient was advised to call back or seek an in-person office evaluation/go to [facility]U at [facility] for any urgent or concerning symptoms. Please refer to After Visit Summary for other counseling recommendations.   I provided 5 minutes of face-to-face time during this encounter.  Return in about 1 week (around 11/08/2021) for OB VISIT, MD or APP.  No future appointments.   Radene Gunning, MD [facility] [place] [place]

## 2021-10-31 NOTE — MAU Note (Signed)
Christina May is a 31 y.o. at [redacted]w[redacted]d here in MAU reporting: contractions started last night, they have gotten worse. No bleeding or LOF. +FM  Onset of complaint: last night  Pain score: 5/10  Vitals:   10/31/21 0858 10/31/21 0921  BP: 127/88 134/87  Pulse: (!) 108 (!) 109  Resp: 18 20  Temp: 98.4 F (36.9 C) 97.6 F (36.4 C)  SpO2: 100% 100%     FHT:133  Lab orders placed from triage: none

## 2021-10-31 NOTE — ED Triage Notes (Signed)
Dr. Ward Chatters.

## 2021-10-31 NOTE — MAU Note (Signed)
Dr Cy Blamer on unit and aware all fern slides are negative and pt has had minimal leaking since then. Will do amnisure

## 2021-10-31 NOTE — Progress Notes (Signed)
Pt to BR earlier. Toco adj

## 2021-10-31 NOTE — ED Triage Notes (Signed)
Pt. Stated, I started having contractions since last night. Im [redacted] weeks pregnant.

## 2021-10-31 NOTE — H&P (Shared)
OBSTETRIC ADMISSION HISTORY AND PHYSICAL  Christina May is a 31 y.o. female [phone] with IUP at [redacted]w[redacted]d by LMP presenting for PROM. She reports +FMs, no LOF, no VB, no blurry vision, headaches,  peripheral edema, or RUQ pain.  She plans on breast feeding. She requests BTL for birth control (consent signed 10/01/21).   She received her prenatal care at [facility].  Dating: By LMP --->  Estimated Date of Delivery: 11/19/21  Sono:   @[redacted]w[redacted]d , CWD, normal anatomy, cephalic presentation, posterior placental lie (circumvallate), 2651 g, 79% EFW  Prenatal History/Complications:  Circumvallate placenta  Past Medical History: Past Medical History:  Diagnosis Date   Fibroid    Hypertension     Past Surgical History: Past Surgical History:  Procedure Laterality Date   BREAST SURGERY     TONSILLECTOMY      Obstetrical History: OB History     Gravida  5   Para  3   Term  3   Preterm      AB  1   Living  3      SAB  1   IAB      Ectopic      Multiple      Live Births  3           Social History Social History   Socioeconomic History   Marital status: Single    Spouse name: Not on file   Number of children: Not on file   Years of education: Not on file   Highest education level: Not on file  Occupational History   Not on file  Tobacco Use   Smoking status: Former    Types: Cigarettes   Smokeless tobacco: Never  Vaping Use   Vaping Use: Never used  Substance and Sexual Activity   Alcohol use: Not Currently   Drug use: Never   Sexual activity: Yes  Other Topics Concern   Not on file  Social History Narrative   Not on file   Social Determinants of Health   Financial Resource Strain: Not on file  Food Insecurity: No Food Insecurity   Worried About Running Out of Food in the Last Year: Never true   Ran Out of Food in the Last Year: Never true  Transportation Needs: No Transportation Needs   Lack of Transportation (Medical): No   Lack of Transportation  (Non-Medical): No  Physical Activity: Not on file  Stress: Not on file  Social Connections: Not on file    Family History: Family History  Problem Relation Age of Onset   Thyroid disease Mother    Cancer Sister    Hypertension Maternal Grandmother    Diabetes Maternal Grandmother    Cancer Maternal Grandfather    Hypertension Paternal Grandmother    Diabetes Paternal Grandmother    Hypertension Paternal Grandfather    Diabetes Paternal Grandfather     Allergies: No Known Allergies  Medications Prior to Admission  Medication Sig Dispense Refill Last Dose   cyclobenzaprine (FLEXERIL) 10 MG tablet Take 1 tablet (10 mg total) by mouth every 8 (eight) hours as needed for muscle spasms. 30 tablet 1    Prenatal Vit-Fe Fumarate-FA (PRENATAL MULTIVITAMIN) TABS tablet Take 1 tablet by mouth daily at 12 noon.        Review of Systems  All systems reviewed and negative except as stated in HPI  Last menstrual period 02/12/2021.  General appearance: alert, cooperative, and no distress Lungs: normal work of breathing on room air  Heart: normal rate, warm and well perfused  Abdomen: soft, non-tender, gravid  Extremities: no LE edema or calf tenderness to palpation   Presentation:  Cephalic Fetal monitoringBaseline: *** bpm, Variability: {fhr variability:31519}, Accelerations: {fhr accel present:31520}, and Decelerations: {FHR DECEL PRESENT:31526} Uterine activity{Uterine contractions:31516} Dilation: 4 Effacement (%): 70 Station: -2 Exam by:: Heywood Bene RN   Prenatal labs: ABO, Rh:   Antibody:   Rubella:   RPR:    HBsAg:    HIV:    GBS: Negative/-- (01/13 1154)  1 hr Glucola normal  Genetic screening - Not done  Anatomy US normal   Prenatal Transfer Tool  Maternal Diabetes: No Genetic Screening: Not done  Maternal Ultrasounds/Referrals: Normal; circumvallate placenta  Fetal Ultrasounds or other Referrals:  None Maternal Substance Abuse:  No Significant Maternal  Medications:  None Significant Maternal Lab Results: Group B Strep negative  Results for orders placed or performed during the hospital encounter of 10/31/21 (from the past 24 hour(s))  CBC   Collection Time: 10/31/21 10:03 AM  Result Value Ref Range   WBC 13.1 (H) 4.0 - 10.5 K/uL   RBC 4.21 3.87 - 5.11 MIL/uL   Hemoglobin 9.7 (L) 12.0 - 15.0 g/dL   HCT 31.8 (L) 36.0 - 46.0 %   MCV 75.5 (L) 80.0 - 100.0 fL   MCH 23.0 (L) 26.0 - 34.0 pg   MCHC 30.5 30.0 - 36.0 g/dL   RDW 15.1 11.5 - 15.5 %   Platelets 161 150 - 400 K/uL   nRBC 0.0 0.0 - 0.2 %  Comprehensive metabolic panel   Collection Time: 10/31/21 10:03 AM  Result Value Ref Range   Sodium 137 135 - 145 mmol/L   Potassium 3.9 3.5 - 5.1 mmol/L   Chloride 108 98 - 111 mmol/L   CO2 21 (L) 22 - 32 mmol/L   Glucose, Bld 107 (H) 70 - 99 mg/dL   BUN <5 (L) 6 - 20 mg/dL   Creatinine, Ser 0.50 0.44 - 1.00 mg/dL   Calcium 8.6 (L) 8.9 - 10.3 mg/dL   Total Protein 6.6 6.5 - 8.1 g/dL   Albumin 2.5 (L) 3.5 - 5.0 g/dL   AST 17 15 - 41 U/L   ALT 10 0 - 44 U/L   Alkaline Phosphatase 197 (H) 38 - 126 U/L   Total Bilirubin 0.3 0.3 - 1.2 mg/dL   GFR, Estimated >60 >60 mL/min   Anion gap 8 5 - 15  Protein / creatinine ratio, urine   Collection Time: 10/31/21 11:17 AM  Result Value Ref Range   Creatinine, Urine 54.05 mg/dL   Total Protein, Urine <6 mg/dL   Protein Creatinine Ratio        0.00 - 0.15 mg/mg[Cre]    Patient Active Problem List   Diagnosis Date Noted   Circumvallate placenta 10/02/2021   Supervision of low-risk pregnancy 10/01/2021   History of gestational hypertension 10/01/2021    Assessment/Plan:  Christina May is a 31 y.o. F7T0240 at [redacted]w[redacted]d here for PROM.   #Labor:*** #Pain: PRN #FWB: Cat *** #ID:  GBS neg #MOF: Breast #MOC: BTL (consent signed 10/01/21) #Circ:  yes   #Circumvellate placenta Anatomy normal with normal growth Genia Del, MD  10/31/2021, 7:43 PM

## 2021-11-01 ENCOUNTER — Encounter (HOSPITAL_COMMUNITY): Payer: Self-pay | Admitting: Obstetrics & Gynecology

## 2021-11-01 ENCOUNTER — Inpatient Hospital Stay (HOSPITAL_COMMUNITY)
Admission: AD | Admit: 2021-11-01 | Discharge: 2021-11-04 | DRG: 798 | Disposition: A | Discharge status: Home / Self Care | Payer: Medicaid Other | Attending: Obstetrics & Gynecology | Admitting: Obstetrics & Gynecology

## 2021-11-01 ENCOUNTER — Encounter: Payer: Self-pay | Admitting: Obstetrics and Gynecology

## 2021-11-01 ENCOUNTER — Other Ambulatory Visit: Payer: Self-pay

## 2021-11-01 ENCOUNTER — Encounter: Payer: Self-pay | Admitting: *Deleted

## 2021-11-01 ENCOUNTER — Telehealth (INDEPENDENT_AMBULATORY_CARE_PROVIDER_SITE_OTHER): Payer: Medicaid Other | Admitting: Obstetrics and Gynecology

## 2021-11-01 VITALS — BP 135/93 | HR 99

## 2021-11-01 DIAGNOSIS — Z3490 Encounter for supervision of normal pregnancy, unspecified, unspecified trimester: Secondary | ICD-10-CM

## 2021-11-01 DIAGNOSIS — Z3A37 37 weeks gestation of pregnancy: Secondary | ICD-10-CM | POA: Diagnosis not present

## 2021-11-01 DIAGNOSIS — Z0371 Encounter for suspected problem with amniotic cavity and membrane ruled out: Secondary | ICD-10-CM | POA: Insufficient documentation

## 2021-11-01 DIAGNOSIS — Z8759 Personal history of other complications of pregnancy, childbirth and the puerperium: Secondary | ICD-10-CM

## 2021-11-01 DIAGNOSIS — O4292 Full-term premature rupture of membranes, unspecified as to length of time between rupture and onset of labor: Secondary | ICD-10-CM | POA: Diagnosis present

## 2021-11-01 DIAGNOSIS — O134 Gestational [pregnancy-induced] hypertension without significant proteinuria, complicating childbirth: Principal | ICD-10-CM | POA: Diagnosis present

## 2021-11-01 DIAGNOSIS — O139 Gestational [pregnancy-induced] hypertension without significant proteinuria, unspecified trimester: Secondary | ICD-10-CM

## 2021-11-01 DIAGNOSIS — O09293 Supervision of pregnancy with other poor reproductive or obstetric history, third trimester: Secondary | ICD-10-CM

## 2021-11-01 DIAGNOSIS — Z3493 Encounter for supervision of normal pregnancy, unspecified, third trimester: Secondary | ICD-10-CM

## 2021-11-01 DIAGNOSIS — Z302 Encounter for sterilization: Secondary | ICD-10-CM

## 2021-11-01 DIAGNOSIS — O43113 Circumvallate placenta, third trimester: Secondary | ICD-10-CM | POA: Diagnosis present

## 2021-11-01 DIAGNOSIS — Z87891 Personal history of nicotine dependence: Secondary | ICD-10-CM | POA: Diagnosis not present

## 2021-11-01 DIAGNOSIS — O43119 Circumvallate placenta, unspecified trimester: Secondary | ICD-10-CM | POA: Diagnosis present

## 2021-11-01 DIAGNOSIS — Z20822 Contact with and (suspected) exposure to covid-19: Secondary | ICD-10-CM | POA: Diagnosis present

## 2021-11-01 DIAGNOSIS — O42913 Preterm premature rupture of membranes, unspecified as to length of time between rupture and onset of labor, third trimester: Secondary | ICD-10-CM | POA: Diagnosis not present

## 2021-11-01 LAB — URINALYSIS, ROUTINE W REFLEX MICROSCOPIC
Bilirubin Urine: NEGATIVE
Glucose, UA: NEGATIVE mg/dL
Hgb urine dipstick: NEGATIVE
Ketones, ur: NEGATIVE mg/dL
Nitrite: NEGATIVE
Protein, ur: NEGATIVE mg/dL
Specific Gravity, Urine: 1.024 (ref 1.005–1.030)
pH: 5 (ref 5.0–8.0)

## 2021-11-01 LAB — CBC
HCT: 33.6 % — ABNORMAL LOW (ref 36.0–46.0)
Hemoglobin: 10.3 g/dL — ABNORMAL LOW (ref 12.0–15.0)
MCH: 23 pg — ABNORMAL LOW (ref 26.0–34.0)
MCHC: 30.7 g/dL (ref 30.0–36.0)
MCV: 75.2 fL — ABNORMAL LOW (ref 80.0–100.0)
Platelets: 167 10*3/uL (ref 150–400)
RBC: 4.47 MIL/uL (ref 3.87–5.11)
RDW: 15.1 % (ref 11.5–15.5)
WBC: 13.6 10*3/uL — ABNORMAL HIGH (ref 4.0–10.5)
nRBC: 0 % (ref 0.0–0.2)

## 2021-11-01 LAB — COMPREHENSIVE METABOLIC PANEL
ALT: 12 U/L (ref 0–44)
AST: 20 U/L (ref 15–41)
Albumin: 2.7 g/dL — ABNORMAL LOW (ref 3.5–5.0)
Alkaline Phosphatase: 223 U/L — ABNORMAL HIGH (ref 38–126)
Anion gap: 10 (ref 5–15)
BUN: <5 mg/dL — ABNORMAL LOW (ref 6–20)
CO2: 20 mmol/L — ABNORMAL LOW (ref 22–32)
Calcium: 9.1 mg/dL (ref 8.9–10.3)
Chloride: 105 mmol/L (ref 98–111)
Creatinine, Ser: 0.53 mg/dL (ref 0.44–1.00)
GFR, Estimated: >60 mL/min (ref >60)
Glucose, Bld: 103 mg/dL — ABNORMAL HIGH (ref 70–99)
Potassium: 3.5 mmol/L (ref 3.5–5.1)
Sodium: 135 mmol/L (ref 135–145)
Total Bilirubin: 0.4 mg/dL (ref 0.3–1.2)
Total Protein: 7.3 g/dL (ref 6.5–8.1)

## 2021-11-01 LAB — PROTEIN / CREATININE RATIO, URINE
Creatinine, Urine: 225.94 mg/dL
Protein Creatinine Ratio: 0.12 mg/mg{Cre} (ref 0.00–0.15)
Total Protein, Urine: 26 mg/dL

## 2021-11-01 LAB — POCT FERN TEST: POCT Fern Test: NEGATIVE

## 2021-11-01 LAB — RPR: RPR Ser Ql: NONREACTIVE

## 2021-11-01 MED ORDER — OXYCODONE-ACETAMINOPHEN 5-325 MG PO TABS: 1.0000 | ORAL_TABLET | ORAL | Status: DC | PRN

## 2021-11-01 MED ORDER — OXYCODONE-ACETAMINOPHEN 5-325 MG PO TABS: 2.0000 | ORAL_TABLET | ORAL | Status: DC | PRN

## 2021-11-01 MED ORDER — SOD CITRATE-CITRIC ACID 500-334 MG/5ML PO SOLN: 30.0000 mL | ORAL | Status: DC | PRN

## 2021-11-01 MED ORDER — LACTATED RINGERS IV SOLN: 500.0000 mL | INTRAVENOUS | Status: DC | PRN

## 2021-11-01 MED ORDER — OXYTOCIN BOLUS FROM INFUSION: 333.0000 mL | Freq: Once | INTRAVENOUS | Status: DC

## 2021-11-01 MED ORDER — OXYTOCIN-SODIUM CHLORIDE 30-0.9 UT/500ML-% IV SOLN
1.0000 m[IU]/min | INTRAVENOUS | Status: DC
  Administered 2021-11-01: 2 m[IU]/min via INTRAVENOUS

## 2021-11-01 MED ORDER — OXYTOCIN BOLUS FROM INFUSION
333.0000 mL | Freq: Once | INTRAVENOUS | Status: AC
  Administered 2021-11-02: 333 mL via INTRAVENOUS

## 2021-11-01 MED ORDER — ACETAMINOPHEN 325 MG PO TABS
650.0000 mg | ORAL_TABLET | ORAL | Status: DC | PRN
  Administered 2021-11-01 – 2021-11-02 (×2): 650 mg via ORAL
  Filled 2021-11-01 (×2): qty 2

## 2021-11-01 MED ORDER — OXYTOCIN-SODIUM CHLORIDE 30-0.9 UT/500ML-% IV SOLN: 2.5000 [IU]/h | INTRAVENOUS | Status: DC

## 2021-11-01 MED ORDER — OXYTOCIN-SODIUM CHLORIDE 30-0.9 UT/500ML-% IV SOLN
2.5000 [IU]/h | INTRAVENOUS | Status: DC
  Administered 2021-11-02: 2.5 [IU]/h via INTRAVENOUS
  Filled 2021-11-01: qty 500

## 2021-11-01 MED ORDER — HYDRALAZINE HCL 20 MG/ML IJ SOLN: 10.0000 mg | INTRAMUSCULAR | Status: DC | PRN

## 2021-11-01 MED ORDER — FENTANYL CITRATE (PF) 100 MCG/2ML IJ SOLN: 50.0000 ug | INTRAMUSCULAR | Status: DC | PRN

## 2021-11-01 MED ORDER — LABETALOL HCL 5 MG/ML IV SOLN: 40.0000 mg | INTRAVENOUS | Status: DC | PRN

## 2021-11-01 MED ORDER — LIDOCAINE HCL (PF) 1 % IJ SOLN
30.0000 mL | INTRAMUSCULAR | Status: AC | PRN
  Administered 2021-11-02: 30 mL via SUBCUTANEOUS
  Filled 2021-11-01: qty 30

## 2021-11-01 MED ORDER — TERBUTALINE SULFATE 1 MG/ML IJ SOLN: 0.2500 mg | Freq: Once | INTRAMUSCULAR | Status: DC | PRN

## 2021-11-01 MED ORDER — LIDOCAINE HCL (PF) 1 % IJ SOLN: 30.0000 mL | INTRAMUSCULAR | Status: DC | PRN

## 2021-11-01 MED ORDER — LABETALOL HCL 5 MG/ML IV SOLN: 80.0000 mg | INTRAVENOUS | Status: DC | PRN

## 2021-11-01 MED ORDER — LACTATED RINGERS IV SOLN: INTRAVENOUS | Status: DC

## 2021-11-01 MED ORDER — ONDANSETRON HCL 4 MG/2ML IJ SOLN: 4.0000 mg | Freq: Four times a day (QID) | INTRAMUSCULAR | Status: DC | PRN

## 2021-11-01 MED ORDER — ACETAMINOPHEN 325 MG PO TABS: 650.0000 mg | ORAL_TABLET | ORAL | Status: DC | PRN

## 2021-11-01 MED ORDER — LABETALOL HCL 5 MG/ML IV SOLN: 20.0000 mg | INTRAVENOUS | Status: DC | PRN

## 2021-11-01 NOTE — MAU Note (Signed)
Presents for BP evaluation.  Reports current H/A and visual disturbances.  Denies epigastric pain.  Endorses +FM.  Denies VB or LOF.

## 2021-11-01 NOTE — Discharge Instructions (Signed)
You came to the hospital because you thought your water broke. We did multiple tests and all of them showed that your water DID not break. We saw discharge and think the leaking you had was likely urine. We also offered you an ultrasound to look at the fluid around the baby - but since all the other tests were negative you decided not to have the ultrasound.   Please return to the hospital if you have contractions that are less than 5 minutes apart and you arent able to breathe through them, if you have bleeding, or do not feel your baby move. If you feel like your water broke please put in a pad and then come back to the hospital.

## 2021-11-01 NOTE — Progress Notes (Signed)
No pooling of fld noted on spec exam

## 2021-11-01 NOTE — MAU Provider Note (Addendum)
History     CSN: [id number]  Arrival date and time: 10/31/21 1924   None     Chief Complaint  Patient presents with   Contractions   Rupture of Membranes   HPI 31 yo F G5P3013 at 12 w2d who presented with concern for her water breaking and also having intermittent contractions. Patient was in MAU earlier today for labor check and found to be consistently 4 cm without making  change and was sent home in the early evening.  She reports around 7PM she was urinating in the toilet and felt more fluid come out even after she had voluntarily stopped urinating. She then stood up and reports more fluid was coming out and therefore decided to come in. Initially when patient came in she was having leaking of fluid on to the floor.   However once on bed and pad placed all fluid stopped coming out. Her fern test was then negative and her amnisure was also negative.   She denies vaginal bleeding and feels baby move. OB History     Gravida  5   Para  3   Term  3   Preterm      AB  1   Living  3      SAB  1   IAB      Ectopic      Multiple      Live Births  3           Past Medical History:  Diagnosis Date   Fibroid    Hypertension     Past Surgical History:  Procedure Laterality Date   BREAST SURGERY     TONSILLECTOMY      Family History  Problem Relation Age of Onset   Thyroid disease Mother    Cancer Sister    Hypertension Maternal Grandmother    Diabetes Maternal Grandmother    Cancer Maternal Grandfather    Hypertension Paternal Grandmother    Diabetes Paternal Grandmother    Hypertension Paternal Grandfather    Diabetes Paternal Grandfather     Social History   Tobacco Use   Smoking status: Former    Types: Cigarettes   Smokeless tobacco: Never  Vaping Use   Vaping Use: Never used  Substance Use Topics   Alcohol use: Not Currently   Drug use: Never    Allergies: No Known Allergies  Medications Prior to Admission  Medication Sig  Dispense Refill Last Dose   cyclobenzaprine (FLEXERIL) 10 MG tablet Take 1 tablet (10 mg total) by mouth every 8 (eight) hours as needed for muscle spasms. 30 tablet 1    Prenatal Vit-Fe Fumarate-FA (PRENATAL MULTIVITAMIN) TABS tablet Take 1 tablet by mouth daily at 12 noon.       Review of Systems  Constitutional:  Negative for fever.  Respiratory:  Negative for shortness of breath.   Cardiovascular:  Negative for chest pain.  Genitourinary:  Negative for dysuria.  Physical Exam   Blood pressure 123/85, pulse 96, temperature 97.7 F (36.5 C), resp. rate 18, last menstrual period 02/12/2021, SpO2 100 %.  Physical Exam Vitals and nursing note reviewed.  Constitutional:      Comments: Appears comfortable. Breathing through contractions. Had one contraction while this provider in room for exam.  HENT:     Head: Normocephalic.  Cardiovascular:     Rate and Rhythm: Normal rate.  Pulmonary:     Effort: Pulmonary effort is normal.  Abdominal:     Comments: Gravid  Genitourinary:    Comments: No evidence of fluid at introitus or gross rupture. On speculum exam, no pooling. Thick mucous noted at cervix. On SVE cervix 4 cm dilated. Skin:    General: Skin is warm.  Neurological:     Mental Status: She is alert.    MAU Course  Procedures  NST: reactive with baseline 130s-140s, no decels, accels present (> 3 15x15). Contractions irregular   MDM Minimal  Assessment and Plan   Rule out ROM, membranes not ruptured 31 yo F G5P3013 at [redacted]w[redacted]d who presented for concern for ROM. Although initially intermittent leaking of some type of fluid noted while patient standing negative fern and negative amnisure. At that time provider evaluated patient and given history of urination immediately before leaking possible urine and not ROM. Pool also negative and 2nd fern also negative. Discussed in detail regarding low likelihood of rupture given above findings. Also discussed that since initial  presentation was concerning for ROM and leaking has now stopped, AFI ordered to evaluate patient's fluid level to complete ROM work up. Patient at that time deferred AFI and prefer to go home since all the other evaluations for ROM have been negative. Discussed return precautions in detail. Patient expressed understanding.  2.  False labor Cervix continued to be 4 cm throughout current visit which is same as it was during her previous visit today to MAU for labor check. Safe to discharge home. Patient has prenatal visit in AM and would like to switch to telemed given at hospital till late at night. Instructed to call office in AM to switch visit type.  3. Hx of elevated BP Patient previously with one elevated BP. During visit here today BP normotensive. Discussed checking BP at home and keeping eye on symptoms. Since changing her in person appt to virtual in the morning we discussed her checking her BP before or during the visit  Discussed care with Dr. Roselie Awkward who agreed with plan. Renard Matter 11/01/2021, 12:38 AM

## 2021-11-01 NOTE — H&P (Signed)
OBSTETRIC ADMISSION HISTORY AND PHYSICAL  Christina May is a 31 y.o. female [phone] with IUP at [redacted]w[redacted]d by LMP presenting for IOL for gHTN. She reports +FMs, no LOF, no VB, no blurry vision, headaches,  peripheral edema, or RUQ pain.  She plans on breast feeding. She requests BTL for birth control (consent signed 10/01/21).   She received her prenatal care at [facility].  Dating: By LMP --->  Estimated Date of Delivery: 11/19/21  Sono:   @[redacted]w[redacted]d , CWD, normal anatomy, cephalic presentation, posterior placental lie (circumvallate), 2651 g, 79% EFW  Prenatal History/Complications:  Circumvallate placenta  Past Medical History: Past Medical History:  Diagnosis Date   Fibroid    Hypertension     Past Surgical History: Past Surgical History:  Procedure Laterality Date   BREAST SURGERY     TONSILLECTOMY      Obstetrical History: OB History     Gravida  5   Para  3   Term  3   Preterm      AB  1   Living  3      SAB  1   IAB      Ectopic      Multiple      Live Births  3           Social History Social History   Socioeconomic History   Marital status: Single    Spouse name: Not on file   Number of children: Not on file   Years of education: Not on file   Highest education level: Not on file  Occupational History   Not on file  Tobacco Use   Smoking status: Former    Types: Cigarettes   Smokeless tobacco: Never  Vaping Use   Vaping Use: Never used  Substance and Sexual Activity   Alcohol use: Not Currently   Drug use: Never   Sexual activity: Yes  Other Topics Concern   Not on file  Social History Narrative   Not on file   Social Determinants of Health   Financial Resource Strain: Not on file  Food Insecurity: No Food Insecurity   Worried About Running Out of Food in the Last Year: Never true   Ran Out of Food in the Last Year: Never true  Transportation Needs: No Transportation Needs   Lack of Transportation (Medical): No   Lack of  Transportation (Non-Medical): No  Physical Activity: Not on file  Stress: Not on file  Social Connections: Not on file    Family History: Family History  Problem Relation Age of Onset   Thyroid disease Mother    Cancer Sister    Hypertension Maternal Grandmother    Diabetes Maternal Grandmother    Cancer Maternal Grandfather    Hypertension Paternal Grandmother    Diabetes Paternal Grandmother    Hypertension Paternal Grandfather    Diabetes Paternal Grandfather     Allergies: No Known Allergies  Medications Prior to Admission  Medication Sig Dispense Refill Last Dose   Prenatal Vit-Fe Fumarate-FA (PRENATAL MULTIVITAMIN) TABS tablet Take 1 tablet by mouth daily at 12 noon.   10/31/2021 at 0600   cyclobenzaprine (FLEXERIL) 10 MG tablet Take 1 tablet (10 mg total) by mouth every 8 (eight) hours as needed for muscle spasms. (Patient not taking: Reported on 11/01/2021) 30 tablet 1      Review of Systems  All systems reviewed and negative except as stated in HPI  Blood pressure 121/74, pulse 85, temperature 98.7 F (37.1 C), temperature  source Oral, resp. rate 18, height 5\' 5"  (1.651 m), weight 90.2 kg, last menstrual period 02/12/2021, SpO2 100 %.  General appearance: alert, cooperative, and no distress Lungs: normal work of breathing on room air  Heart: normal rate, warm and well perfused  Abdomen: soft, non-tender, gravid  Extremities: no LE edema or calf tenderness to palpation   Presentation:  Cephalic Fetal monitoringBaseline: 140 bpm, Variability: Good {> 6 bpm), Accelerations: Reactive, and Decelerations: Absent Uterine activity irregular     Prenatal labs: ABO, Rh: --/--/A POS (01/19 1955) Antibody: NEG (01/19 1955) Rubella:  immune 05/21/2021 RPR: NON REACTIVE (01/19 1955)  HBsAg:   Negative 05/21/2021 HIV:   Negative 08/16/2021 GBS: Negative/-- (01/13 1154)  1 hr Glucola normal  Genetic screening - Not done  Anatomy US normal   Prenatal Transfer Tool   Maternal Diabetes: No Genetic Screening: Not done  Maternal Ultrasounds/Referrals: Normal; circumvallate placenta  Fetal Ultrasounds or other Referrals:  None Maternal Substance Abuse:  No Significant Maternal Medications:  None Significant Maternal Lab Results: Group B Strep negative  Results for orders placed or performed during the hospital encounter of 11/01/21 (from the past 24 hour(s))  Urinalysis, Routine w reflex microscopic Urine, Clean Catch   Collection Time: 11/01/21  5:21 PM  Result Value Ref Range   Color, Urine YELLOW YELLOW   APPearance HAZY (A) CLEAR   Specific Gravity, Urine 1.024 1.005 - 1.030   pH 5.0 5.0 - 8.0   Glucose, UA NEGATIVE NEGATIVE mg/dL   Hgb urine dipstick NEGATIVE NEGATIVE   Bilirubin Urine NEGATIVE NEGATIVE   Ketones, ur NEGATIVE NEGATIVE mg/dL   Protein, ur NEGATIVE NEGATIVE mg/dL   Nitrite NEGATIVE NEGATIVE   Leukocytes,Ua SMALL (A) NEGATIVE   RBC / HPF 0-5 0 - 5 RBC/hpf   WBC, UA 6-10 0 - 5 WBC/hpf   Bacteria, UA RARE (A) NONE SEEN   Squamous Epithelial / LPF 0-5 0 - 5   Mucus PRESENT   Protein / creatinine ratio, urine   Collection Time: 11/01/21  5:21 PM  Result Value Ref Range   Creatinine, Urine 225.94 mg/dL   Total Protein, Urine 26 mg/dL   Protein Creatinine Ratio 0.12 0.00 - 0.15 mg/mg[Cre]  CBC   Collection Time: 11/01/21  8:40 PM  Result Value Ref Range   WBC 13.6 (H) 4.0 - 10.5 K/uL   RBC 4.47 3.87 - 5.11 MIL/uL   Hemoglobin 10.3 (L) 12.0 - 15.0 g/dL   HCT 33.6 (L) 36.0 - 46.0 %   MCV 75.2 (L) 80.0 - 100.0 fL   MCH 23.0 (L) 26.0 - 34.0 pg   MCHC 30.7 30.0 - 36.0 g/dL   RDW 15.1 11.5 - 15.5 %   Platelets 167 150 - 400 K/uL   nRBC 0.0 0.0 - 0.2 %  Comprehensive metabolic panel   Collection Time: 11/01/21  8:40 PM  Result Value Ref Range   Sodium 135 135 - 145 mmol/L   Potassium 3.5 3.5 - 5.1 mmol/L   Chloride 105 98 - 111 mmol/L   CO2 20 (L) 22 - 32 mmol/L   Glucose, Bld 103 (H) 70 - 99 mg/dL   BUN <5 (L) 6 - 20  mg/dL   Creatinine, Ser 0.53 0.44 - 1.00 mg/dL   Calcium 9.1 8.9 - 10.3 mg/dL   Total Protein 7.3 6.5 - 8.1 g/dL   Albumin 2.7 (L) 3.5 - 5.0 g/dL   AST 20 15 - 41 U/L   ALT 12 0 - 44 U/L  Alkaline Phosphatase 223 (H) 38 - 126 U/L   Total Bilirubin 0.4 0.3 - 1.2 mg/dL   GFR, Estimated >60 >60 mL/min   Anion gap 10 5 - 15  Results for orders placed or performed during the hospital encounter of 10/31/21 (from the past 24 hour(s))  [facility] Test   Collection Time: 11/01/21 12:19 AM  Result Value Ref Range   POCT Fern Test Negative = intact amniotic membranes     Patient Active Problem List   Diagnosis Date Noted   Indication for care in labor and delivery, antepartum 11/01/2021   PROM (premature rupture of membranes) 11/01/2021   Circumvallate placenta 10/02/2021   Supervision of low-risk pregnancy 10/01/2021   History of gestational hypertension 10/01/2021    Assessment/Plan:  Chessica Audia is a 31 y.o. [id number] at [redacted]w[redacted]d here for IOL for gHTN. Had HA earlier and resolved on own. Headache now coming back.   #Labor:cervix 4 cm. Will start pitocin. Head ballotable and -3 station. Will assess for AROM at next check. #Pain: PRN #FWB: Cat I #ID:  GBS neg #MOF: Breast #MOC: BTL (consent signed 10/01/21) #Circ:  yes  #Circumvellate placenta Anatomy normal with normal growth  #gHTN Patient's BP elevated and now meets criteria for gHTN. Admission preE labs normal. HA is back and just got tylenol. BP mild range and normal labs. Will reassess in 1 hr. If continues to have HA will discuss Mg  Renard Matter, MD  11/01/2021, 11:28 PM

## 2021-11-01 NOTE — MAU Provider Note (Signed)
Pt informed that the ultrasound is considered a limited OB ultrasound and is not intended to be a complete ultrasound exam.  Patient also informed that the ultrasound is not being completed with the intent of assessing for fetal or placental anomalies or any pelvic abnormalities.  Explained that the purpose of todays ultrasound is to assess for  presentation (VTX).  Patient acknowledges the purpose of the exam and the limitations of the study.    Clarisa Fling, NP  7:29 PM 11/01/2021

## 2021-11-01 NOTE — Progress Notes (Signed)
Dr Cy Blamer in earlier to discuss d/c plan. Written and verbal d/c instructions given and understanding voiced

## 2021-11-02 ENCOUNTER — Inpatient Hospital Stay (HOSPITAL_COMMUNITY): Payer: Medicaid Other | Admitting: Certified Registered"

## 2021-11-02 ENCOUNTER — Encounter (HOSPITAL_COMMUNITY)
Admission: AD | Disposition: A | Discharge status: Home / Self Care | Payer: Self-pay | Source: Home / Self Care | Attending: Obstetrics & Gynecology

## 2021-11-02 ENCOUNTER — Encounter (HOSPITAL_COMMUNITY): Payer: Self-pay | Admitting: Obstetrics & Gynecology

## 2021-11-02 DIAGNOSIS — Z302 Encounter for sterilization: Secondary | ICD-10-CM

## 2021-11-02 DIAGNOSIS — O42913 Preterm premature rupture of membranes, unspecified as to length of time between rupture and onset of labor, third trimester: Secondary | ICD-10-CM

## 2021-11-02 DIAGNOSIS — O134 Gestational [pregnancy-induced] hypertension without significant proteinuria, complicating childbirth: Secondary | ICD-10-CM

## 2021-11-02 DIAGNOSIS — Z3A37 37 weeks gestation of pregnancy: Secondary | ICD-10-CM

## 2021-11-02 DIAGNOSIS — O43113 Circumvallate placenta, third trimester: Secondary | ICD-10-CM

## 2021-11-02 HISTORY — PX: TUBAL LIGATION: SHX77

## 2021-11-02 LAB — CBC
HCT: 31.9 % — ABNORMAL LOW (ref 36.0–46.0)
Hemoglobin: 10 g/dL — ABNORMAL LOW (ref 12.0–15.0)
MCH: 23.4 pg — ABNORMAL LOW (ref 26.0–34.0)
MCHC: 31.3 g/dL (ref 30.0–36.0)
MCV: 74.7 fL — ABNORMAL LOW (ref 80.0–100.0)
Platelets: 161 10*3/uL (ref 150–400)
RBC: 4.27 MIL/uL (ref 3.87–5.11)
RDW: 15.2 % (ref 11.5–15.5)
WBC: 18.7 10*3/uL — ABNORMAL HIGH (ref 4.0–10.5)
nRBC: 0 % (ref 0.0–0.2)

## 2021-11-02 LAB — COMPREHENSIVE METABOLIC PANEL
ALT: 10 U/L (ref 0–44)
AST: 16 U/L (ref 15–41)
Albumin: 2.3 g/dL — ABNORMAL LOW (ref 3.5–5.0)
Alkaline Phosphatase: 190 U/L — ABNORMAL HIGH (ref 38–126)
Anion gap: 7 (ref 5–15)
BUN: <5 mg/dL — ABNORMAL LOW (ref 6–20)
CO2: 20 mmol/L — ABNORMAL LOW (ref 22–32)
Calcium: 8.7 mg/dL — ABNORMAL LOW (ref 8.9–10.3)
Chloride: 107 mmol/L (ref 98–111)
Creatinine, Ser: 0.46 mg/dL (ref 0.44–1.00)
GFR, Estimated: >60 mL/min (ref >60)
Glucose, Bld: 97 mg/dL (ref 70–99)
Potassium: 3.4 mmol/L — ABNORMAL LOW (ref 3.5–5.1)
Sodium: 134 mmol/L — ABNORMAL LOW (ref 135–145)
Total Bilirubin: 0.4 mg/dL (ref 0.3–1.2)
Total Protein: 6.4 g/dL — ABNORMAL LOW (ref 6.5–8.1)

## 2021-11-02 LAB — RPR: RPR Ser Ql: NONREACTIVE

## 2021-11-02 SURGERY — LIGATION, FALLOPIAN TUBE, POSTPARTUM
Anesthesia: General | Laterality: Bilateral

## 2021-11-02 MED ORDER — DEXAMETHASONE SODIUM PHOSPHATE 10 MG/ML IJ SOLN
INTRAMUSCULAR | Status: AC
  Filled 2021-11-02: qty 1

## 2021-11-02 MED ORDER — OXYCODONE HCL 5 MG/5ML PO SOLN: 5.0000 mg | Freq: Once | ORAL | Status: DC | PRN

## 2021-11-02 MED ORDER — IBUPROFEN 600 MG PO TABS
600.0000 mg | ORAL_TABLET | Freq: Four times a day (QID) | ORAL | Status: DC
  Administered 2021-11-02 – 2021-11-04 (×7): 600 mg via ORAL
  Filled 2021-11-02 (×7): qty 1

## 2021-11-02 MED ORDER — ACETAMINOPHEN 10 MG/ML IV SOLN
INTRAVENOUS | Status: AC
  Filled 2021-11-02: qty 100

## 2021-11-02 MED ORDER — WITCH HAZEL-GLYCERIN EX PADS: 1.0000 "application " | MEDICATED_PAD | CUTANEOUS | Status: DC | PRN

## 2021-11-02 MED ORDER — ONDANSETRON HCL 4 MG/2ML IJ SOLN: 4.0000 mg | INTRAMUSCULAR | Status: DC | PRN

## 2021-11-02 MED ORDER — FUROSEMIDE 20 MG PO TABS
20.0000 mg | ORAL_TABLET | Freq: Every day | ORAL | Status: DC
  Administered 2021-11-03 – 2021-11-04 (×2): 20 mg via ORAL
  Filled 2021-11-02 (×2): qty 1

## 2021-11-02 MED ORDER — PROPOFOL 10 MG/ML IV BOLUS
INTRAVENOUS | Status: DC | PRN
Start: 2021-11-02 — End: 2021-11-02
  Administered 2021-11-02: 200 mg via INTRAVENOUS

## 2021-11-02 MED ORDER — SUCCINYLCHOLINE CHLORIDE 200 MG/10ML IV SOSY
PREFILLED_SYRINGE | INTRAVENOUS | Status: DC | PRN
  Administered 2021-11-02: 100 mg via INTRAVENOUS

## 2021-11-02 MED ORDER — LIDOCAINE 2% (20 MG/ML) 5 ML SYRINGE
INTRAMUSCULAR | Status: AC
  Filled 2021-11-02: qty 5

## 2021-11-02 MED ORDER — ONDANSETRON HCL 4 MG/2ML IJ SOLN
INTRAMUSCULAR | Status: DC | PRN
  Administered 2021-11-02: 4 mg via INTRAVENOUS

## 2021-11-02 MED ORDER — ACETAMINOPHEN 10 MG/ML IV SOLN
INTRAVENOUS | Status: DC | PRN
  Administered 2021-11-02: 1000 mg via INTRAVENOUS

## 2021-11-02 MED ORDER — PHENYLEPHRINE 40 MCG/ML (10ML) SYRINGE FOR IV PUSH (FOR BLOOD PRESSURE SUPPORT)
PREFILLED_SYRINGE | INTRAVENOUS | Status: AC
  Filled 2021-11-02: qty 10

## 2021-11-02 MED ORDER — STERILE WATER FOR IRRIGATION IR SOLN
Status: DC | PRN
  Administered 2021-11-02: 1

## 2021-11-02 MED ORDER — BENZOCAINE-MENTHOL 20-0.5 % EX AERO: 1.0000 "application " | INHALATION_SPRAY | CUTANEOUS | Status: DC | PRN

## 2021-11-02 MED ORDER — ACETAMINOPHEN 325 MG PO TABS
650.0000 mg | ORAL_TABLET | ORAL | Status: DC | PRN
  Administered 2021-11-02 – 2021-11-03 (×3): 650 mg via ORAL
  Filled 2021-11-02 (×4): qty 2

## 2021-11-02 MED ORDER — MIDAZOLAM HCL 2 MG/2ML IJ SOLN
INTRAMUSCULAR | Status: AC
  Filled 2021-11-02: qty 2

## 2021-11-02 MED ORDER — SIMETHICONE 80 MG PO CHEW
80.0000 mg | CHEWABLE_TABLET | ORAL | Status: DC | PRN
  Administered 2021-11-03 (×2): 80 mg via ORAL
  Filled 2021-11-02 (×2): qty 1

## 2021-11-02 MED ORDER — FENTANYL CITRATE (PF) 100 MCG/2ML IJ SOLN
INTRAMUSCULAR | Status: DC | PRN
  Administered 2021-11-02: 100 ug via INTRAVENOUS

## 2021-11-02 MED ORDER — OXYCODONE HCL 5 MG PO TABS: 5.0000 mg | ORAL_TABLET | Freq: Once | ORAL | Status: DC | PRN

## 2021-11-02 MED ORDER — PROPOFOL 10 MG/ML IV BOLUS
INTRAVENOUS | Status: AC
  Filled 2021-11-02: qty 40

## 2021-11-02 MED ORDER — FENTANYL CITRATE (PF) 100 MCG/2ML IJ SOLN
INTRAMUSCULAR | Status: AC
  Filled 2021-11-02: qty 2

## 2021-11-02 MED ORDER — TETANUS-DIPHTH-ACELL PERTUSSIS 5-2.5-18.5 LF-MCG/0.5 IM SUSY: 0.5000 mL | PREFILLED_SYRINGE | Freq: Once | INTRAMUSCULAR | Status: DC

## 2021-11-02 MED ORDER — BUPIVACAINE HCL (PF) 0.25 % IJ SOLN
INTRAMUSCULAR | Status: DC | PRN
  Administered 2021-11-02: 10 mL

## 2021-11-02 MED ORDER — BUTALBITAL-APAP-CAFFEINE 50-325-40 MG PO TABS
1.0000 | ORAL_TABLET | Freq: Once | ORAL | Status: AC
  Administered 2021-11-02: 1 via ORAL
  Filled 2021-11-02: qty 1

## 2021-11-02 MED ORDER — MEDROXYPROGESTERONE ACETATE 150 MG/ML IM SUSP: 150.0000 mg | INTRAMUSCULAR | Status: DC | PRN

## 2021-11-02 MED ORDER — PRENATAL MULTIVITAMIN CH
1.0000 | ORAL_TABLET | Freq: Every day | ORAL | Status: DC
  Administered 2021-11-03: 1 via ORAL
  Filled 2021-11-02: qty 1

## 2021-11-02 MED ORDER — NIFEDIPINE ER OSMOTIC RELEASE 30 MG PO TB24
30.0000 mg | ORAL_TABLET | Freq: Every day | ORAL | Status: DC
  Administered 2021-11-03 – 2021-11-04 (×2): 30 mg via ORAL
  Filled 2021-11-02 (×2): qty 1

## 2021-11-02 MED ORDER — MIDAZOLAM HCL 2 MG/2ML IJ SOLN
INTRAMUSCULAR | Status: DC | PRN
  Administered 2021-11-02: 2 mg via INTRAVENOUS

## 2021-11-02 MED ORDER — DIBUCAINE (PERIANAL) 1 % EX OINT: 1.0000 "application " | TOPICAL_OINTMENT | CUTANEOUS | Status: DC | PRN

## 2021-11-02 MED ORDER — MEASLES, MUMPS & RUBELLA VAC IJ SOLR: 0.5000 mL | Freq: Once | INTRAMUSCULAR | Status: DC

## 2021-11-02 MED ORDER — OXYCODONE HCL 5 MG PO TABS
5.0000 mg | ORAL_TABLET | ORAL | Status: DC | PRN
  Administered 2021-11-02 – 2021-11-03 (×3): 5 mg via ORAL
  Filled 2021-11-02 (×3): qty 1

## 2021-11-02 MED ORDER — ONDANSETRON HCL 4 MG/2ML IJ SOLN: 4.0000 mg | Freq: Once | INTRAMUSCULAR | Status: DC | PRN

## 2021-11-02 MED ORDER — DIPHENHYDRAMINE HCL 25 MG PO CAPS: 25.0000 mg | ORAL_CAPSULE | Freq: Four times a day (QID) | ORAL | Status: DC | PRN

## 2021-11-02 MED ORDER — ONDANSETRON HCL 4 MG/2ML IJ SOLN
INTRAMUSCULAR | Status: AC
  Filled 2021-11-02: qty 2

## 2021-11-02 MED ORDER — FENTANYL CITRATE (PF) 100 MCG/2ML IJ SOLN: 25.0000 ug | INTRAMUSCULAR | Status: DC | PRN

## 2021-11-02 MED ORDER — PHENYLEPHRINE HCL (PRESSORS) 10 MG/ML IV SOLN
INTRAVENOUS | Status: DC | PRN
  Administered 2021-11-02 (×3): 40 ug via INTRAVENOUS

## 2021-11-02 MED ORDER — ONDANSETRON HCL 4 MG PO TABS: 4.0000 mg | ORAL_TABLET | ORAL | Status: DC | PRN

## 2021-11-02 MED ORDER — SODIUM CHLORIDE 0.9 % IR SOLN
Status: DC | PRN
  Administered 2021-11-02: 1000 mL

## 2021-11-02 MED ORDER — SUCCINYLCHOLINE CHLORIDE 200 MG/10ML IV SOSY
PREFILLED_SYRINGE | INTRAVENOUS | Status: AC
  Filled 2021-11-02: qty 10

## 2021-11-02 MED ORDER — LIDOCAINE HCL (CARDIAC) PF 100 MG/5ML IV SOSY
PREFILLED_SYRINGE | INTRAVENOUS | Status: DC | PRN
  Administered 2021-11-02: 100 mg via INTRAVENOUS

## 2021-11-02 MED ORDER — DEXMEDETOMIDINE (PRECEDEX) IN NS 20 MCG/5ML (4 MCG/ML) IV SYRINGE
PREFILLED_SYRINGE | INTRAVENOUS | Status: DC | PRN
  Administered 2021-11-02: 8 ug via INTRAVENOUS

## 2021-11-02 MED ORDER — KETOROLAC TROMETHAMINE 30 MG/ML IJ SOLN
INTRAMUSCULAR | Status: DC | PRN
  Administered 2021-11-02: 30 mg via INTRAVENOUS

## 2021-11-02 MED ORDER — SENNOSIDES-DOCUSATE SODIUM 8.6-50 MG PO TABS
2.0000 | ORAL_TABLET | Freq: Every day | ORAL | Status: DC
  Administered 2021-11-03 – 2021-11-04 (×2): 2 via ORAL
  Filled 2021-11-02 (×2): qty 2

## 2021-11-02 MED ORDER — COCONUT OIL OIL: 1.0000 "application " | TOPICAL_OIL | Status: DC | PRN

## 2021-11-02 MED ORDER — DEXMEDETOMIDINE (PRECEDEX) IN NS 20 MCG/5ML (4 MCG/ML) IV SYRINGE
PREFILLED_SYRINGE | INTRAVENOUS | Status: AC
  Filled 2021-11-02: qty 5

## 2021-11-02 MED ORDER — DEXAMETHASONE SODIUM PHOSPHATE 10 MG/ML IJ SOLN
INTRAMUSCULAR | Status: DC | PRN
  Administered 2021-11-02: 10 mg via INTRAVENOUS

## 2021-11-02 MED ORDER — LACTATED RINGERS IV SOLN: INTRAVENOUS | Status: DC | PRN

## 2021-11-02 MED ORDER — KETOROLAC TROMETHAMINE 30 MG/ML IJ SOLN
INTRAMUSCULAR | Status: AC
  Filled 2021-11-02: qty 1

## 2021-11-02 SURGICAL SUPPLY — 21 items
CLIP FILSHIE TUBAL LIGA STRL (Clip) ×4 IMPLANT
CLOTH BEACON ORANGE TIMEOUT ST (SAFETY) ×2 IMPLANT
DRSG OPSITE POSTOP 3X4 (GAUZE/BANDAGES/DRESSINGS) ×2 IMPLANT
DURAPREP 26ML APPLICATOR (WOUND CARE) ×2 IMPLANT
GLOVE BIOGEL PI IND STRL 7.0 (GLOVE) ×2 IMPLANT
GLOVE BIOGEL PI INDICATOR 7.0 (GLOVE) ×2
GLOVE ECLIPSE 7.0 STRL STRAW (GLOVE) ×4 IMPLANT
GOWN STRL REUS W/TWL LRG LVL3 (GOWN DISPOSABLE) ×4 IMPLANT
NEEDLE HYPO 22GX1.5 SAFETY (NEEDLE) ×2 IMPLANT
NS IRRIG 1000ML POUR BTL (IV SOLUTION) ×2 IMPLANT
PACK ABDOMINAL MINOR (CUSTOM PROCEDURE TRAY) ×2 IMPLANT
PROTECTOR NERVE ULNAR (MISCELLANEOUS) ×2 IMPLANT
SPONGE LAP 4X18 RFD (DISPOSABLE) IMPLANT
SUT MON AB-0 CT1 36 (SUTURE) IMPLANT
SUT VIC AB 0 CT1 27 (SUTURE) ×2
SUT VIC AB 0 CT1 27XBRD ANBCTR (SUTURE) ×1 IMPLANT
SUT VICRYL 4-0 PS2 18IN ABS (SUTURE) ×2 IMPLANT
SYR CONTROL 10ML LL (SYRINGE) ×2 IMPLANT
TOWEL OR 17X24 6PK STRL BLUE (TOWEL DISPOSABLE) ×4 IMPLANT
TRAY FOLEY CATH SILVER 14FR (SET/KITS/TRAYS/PACK) ×2 IMPLANT
WATER STERILE IRR 1000ML POUR (IV SOLUTION) ×2 IMPLANT

## 2021-11-02 NOTE — Op Note (Addendum)
Christina May  11/01/2021 - 11/02/2021  PREOPERATIVE DIAGNOSIS:  Multiparity, undesired fertility  POSTOPERATIVE DIAGNOSIS:  Multiparity, undesired fertility  PROCEDURE:  Postpartum Bilateral Tubal Sterilization using Filshie Clips patient declined salpingectomy  ANESTHESIA:  General and local analgesia using 0.63% Marcaine  COMPLICATIONS:  None immediate.  ESTIMATED BLOOD LOSS: 5 ml.  INDICATIONS: 31 y.o. [id number]  with undesired fertility,status post vaginal delivery, desires permanent sterilization.  Other reversible forms of contraception were discussed with patient; she declines all other modalities. Risks of procedure discussed with patient including but not limited to: risk of regret, permanence of method, bleeding, infection, injury to surrounding organs and need for additional procedures.  Failure risk of 0.5-1% with increased risk of ectopic gestation if pregnancy occurs was also discussed with patient.     FINDINGS:  Normal uterus, tubes.  PROCEDURE DETAILS: The patient was taken to the operating room where her spinal anesthesia was dosed up to surgical level and found to be adequate.  She was then placed in a supine position and prepped and draped in the usual sterile fashion.  After an adequate timeout was performed, attention was turned to the patient's abdomen where a small transverse skin incision was made under the umbilical fold. The incision was taken down to the layer of fascia using the scalpel, and fascia was incised, and extended bilaterally. The peritoneum was entered in a sharp fashion. The patient was placed in Trendelenburg.  The left fallopian tube was identified and grasped with a Babcock clamp, and followed out to the fimbriated end.  A Filshie clip was placed on the left fallopian tube about 2 cm from the cornu.  A similar process was carried out on the right side allowing for bilateral tubal sterilization.  Good hemostasis was noted overall.  Local analgesia was  injected into both Filshie application sites.The instruments were then removed from the patient's abdomen and the fascial incision was repaired with 0 Vicryl, and the skin was closed with a 4-0 Vicryl subcuticular stitch. The patient tolerated the procedure well.  Sponge, lap, and needle counts were correct times two.  The patient was then taken to the recovery room awake, extubated and in stable condition.  Donnamae Jude MD 11/02/2021 2:00 PM

## 2021-11-02 NOTE — Transfer of Care (Signed)
Immediate Anesthesia Transfer of Care Note  Patient: Christina May  Procedure(s) Performed: POST PARTUM TUBAL LIGATION (Bilateral)  Patient Location: PACU  Anesthesia Type:General  Level of Consciousness: awake, alert  and oriented  Airway & Oxygen Therapy: Patient Spontanous Breathing and Patient connected to nasal cannula oxygen  Post-op Assessment: Report given to RN and Post -op Vital signs reviewed and stable  Post vital signs: Reviewed and stable  Last Vitals:  Vitals Value Taken Time  BP 120/79 11/02/21 1415  Temp    Pulse 102 11/02/21 1418  Resp 16 11/02/21 1418  SpO2 95 % 11/02/21 1418  Vitals shown include unvalidated device data.  Last Pain:  Vitals:   11/02/21 1204  TempSrc: Oral  PainSc: 5          Complications: No notable events documented.

## 2021-11-02 NOTE — Lactation Note (Addendum)
This note was copied from a baby's chart. Lactation Consultation Note  Patient Name: Christina May Date: 11/02/2021   Age:29 hours  "Cree" was sleeping skin-to-skin on Mom's chest at time of consult. Infant had already fed well about 1.5 hrs ago.  Mom's lactation hx is significant for being a P4 with a hx of an abundant supply once her milk came to volume. Mom had a fibroid removed from the R side of her R breast in 2012. Mom nursed her 1st child for 8-9 mo & her 3rd child for 6 mo. She did not nurse her 2nd child due to the need for breast surgery and uncertainty prior to surgery if the tumor (fibroid) was cancerous.   Mom has no questions for me at this time, but knows she can call for me. Mom was made aware of O/P services, breastfeeding support groups, community resources (including [employer]) and our phone # for post-discharge questions.   This is Mom's 1st baby born at 6 weeks. Two children were born at term; one child was born at 64 weeks.    Matthias Hughs [facility] 11/02/2021, 8:16 AM

## 2021-11-02 NOTE — Anesthesia Postprocedure Evaluation (Signed)
Anesthesia Post Note  Patient: Christina May  Procedure(s) Performed: POST PARTUM TUBAL LIGATION (Bilateral)     Patient location during evaluation: PACU Anesthesia Type: General Level of consciousness: awake and alert Pain management: pain level controlled Vital Signs Assessment: post-procedure vital signs reviewed and stable Respiratory status: spontaneous breathing, nonlabored ventilation and respiratory function stable Cardiovascular status: blood pressure returned to baseline and stable Postop Assessment: no apparent nausea or vomiting Anesthetic complications: no   No notable events documented.  Last Vitals:  Vitals:   11/02/21 1500 11/02/21 1525  BP: 122/84 124/87  Pulse: 72 79  Resp: 14 16  Temp:  (!) 36.3 C  SpO2: 98% 100%    Last Pain:  Vitals:   11/02/21 1528  TempSrc:   PainSc: 0-No pain   Pain Goal:                Epidural/Spinal Function Cutaneous sensation: Normal sensation (11/02/21 1528)  Lidia Collum

## 2021-11-02 NOTE — Anesthesia Preprocedure Evaluation (Addendum)
Anesthesia Evaluation  Patient identified by MRN, date of birth, ID band Patient awake    Reviewed: Allergy & Precautions, NPO status , Patient's Chart, lab work & pertinent test results  History of Anesthesia Complications Negative for: history of anesthetic complications  Airway Mallampati: II  TM Distance: >3 FB Neck ROM: Full    Dental  (+) Teeth Intact, Dental Advisory Given Permanent retainer:   Pulmonary neg pulmonary ROS, former smoker,    Pulmonary exam normal        Cardiovascular hypertension, Normal cardiovascular exam     Neuro/Psych negative neurological ROS     GI/Hepatic negative GI ROS, Neg liver ROS,   Endo/Other  negative endocrine ROS  Renal/GU negative Renal ROS  negative genitourinary   Musculoskeletal negative musculoskeletal ROS (+)   Abdominal   Peds  Hematology negative hematology ROS (+)   Anesthesia Other Findings   Reproductive/Obstetrics 1 day postpartum                            Anesthesia Physical Anesthesia Plan  ASA: 2  Anesthesia Plan: General   Post-op Pain Management: Toradol IV (intra-op) and Ofirmev IV (intra-op)   Induction: Intravenous and Rapid sequence  PONV Risk Score and Plan: 4 or greater and Ondansetron, Dexamethasone, Treatment may vary due to age or medical condition and Midazolam  Airway Management Planned: Oral ETT  Additional Equipment: None  Intra-op Plan:   Post-operative Plan: Extubation in OR  Informed Consent: I have reviewed the patients History and Physical, chart, labs and discussed the procedure including the risks, benefits and alternatives for the proposed anesthesia with the patient or authorized representative who has indicated his/her understanding and acceptance.     Dental advisory given  Plan Discussed with:   Anesthesia Plan Comments:        Anesthesia Quick Evaluation

## 2021-11-02 NOTE — Discharge Summary (Signed)
Postpartum Discharge Summary      Patient Name: Christina May DOB: [birth year] MRN: [id number]  Date of admission: 11/01/2021 Delivery date:11/02/2021  Delivering provider: Renard Matter  Date of discharge: 11/04/2021  Admitting diagnosis: Indication for care in labor and delivery, antepartum [O75.9] PROM (premature rupture of membranes) [O42.90] Intrauterine pregnancy: [redacted]w[redacted]d    Secondary diagnosis:  Principal Problem:   Indication for care in labor and delivery, antepartum Active Problems:   Supervision of low-risk pregnancy   Circumvallate placenta   Gestational HTN   Vaginal delivery  Additional problems: None    Discharge diagnosis: Term Pregnancy Delivered and Gestational Hypertension                                              Post partum procedures: PP BTL Augmentation: AROM and Pitocin Complications: None  Hospital course: Induction of Labor With Vaginal Delivery   31y.o. yo [id number] admitted to the hospital 11/01/2021 for induction of labor.  Indication for induction:  gHTN .  Patient had an uncomplicated labor course as follows: Membrane Rupture Time/Date: 3:51 AM ,11/02/2021   Delivery Method:Vaginal, Spontaneous  Episiotomy: None  Lacerations:  Periurethral  Details of delivery can be found in separate delivery note.  Patient had a routine postpartum course. She had more than 3 BP >130 or >80 and therefore was started on Procardia and a 5 day course of Lasix. Patient is discharged home 11/04/21.  Newborn Data: Birth date:11/02/2021  Birth time:4:58 AM  Gender:Female  Living status:Living  Apgars:8 ,9  Weight:3515 g   Magnesium Sulfate received: No BMZ received: No Rhophylac:N/A MMR:N/A T-DaP:Offered PP Flu: No Transfusion:No  Physical exam  Vitals:   11/03/21 0626 11/03/21 1000 11/03/21 1542 11/03/21 2135  BP: 106/76 114/76 111/76 109/75  Pulse: 81 88 83 79  Resp: _0 Temp: 97.9 F (36.6 C) 98 F (36.7 C) 98.2 F (36.8 C) 99.1  F (37.3 C)  TempSrc: Oral Oral Oral Oral  SpO2: 100%   99%  Weight:      Height:       General: alert Lochia: appropriate Uterine Fundus: firm Incision: Dressing is clean, dry, and intact DVT Evaluation: No evidence of DVT seen on physical exam. Labs: Lab Results  Component Value Date   WBC 18.7 (H) 11/02/2021   HGB 10.0 (L) 11/02/2021   HCT 31.9 (L) 11/02/2021   MCV 74.7 (L) 11/02/2021   PLT 161 11/02/2021   CMP Latest Ref Rng & Units 11/02/2021  Glucose 70 - 99 mg/dL 97  BUN 6 - 20 mg/dL <5(L)  Creatinine 0.44 - 1.00 mg/dL 0.46  Sodium 135 - 145 mmol/L 134(L)  Potassium 3.5 - 5.1 mmol/L 3.4(L)  Chloride 98 - 111 mmol/L 107  CO2 22 - 32 mmol/L 20(L)  Calcium 8.9 - 10.3 mg/dL 8.7(L)  Total Protein 6.5 - 8.1 g/dL 6.4(L)  Total Bilirubin 0.3 - 1.2 mg/dL 0.4  Alkaline Phos 38 - 126 U/L 190(H)  AST 15 - 41 U/L 16  ALT 0 - 44 U/L 10   Edinburgh Score: Edinburgh Postnatal Depression Scale Screening Tool 11/02/2021  I have been able to laugh and see the funny side of things. 0  I have looked forward with enjoyment to things. 0  I have blamed myself unnecessarily when things went wrong. 0  I have  been anxious or worried for no good reason. 0  I have felt scared or panicky for no good reason. 0  Things have been getting on top of me. 0  I have been so unhappy that I have had difficulty sleeping. 0  I have felt sad or miserable. 0  I have been so unhappy that I have been crying. 0  The thought of harming myself has occurred to me. 0  Edinburgh Postnatal Depression Scale Total 0     After visit meds:  Allergies as of 11/04/2021   No Known Allergies      Medication List     STOP taking these medications    cyclobenzaprine 10 MG tablet Commonly known as: FLEXERIL       TAKE these medications    acetaminophen 325 MG tablet Commonly known as: Tylenol Take 2 tablets (650 mg total) by mouth every 4 (four) hours as needed (for pain scale < 4).   furosemide 20 MG  tablet Commonly known as: LASIX Take 1 tablet (20 mg total) by mouth daily for 4 days.   ibuprofen 600 MG tablet Commonly known as: ADVIL Take 1 tablet (600 mg total) by mouth every 6 (six) hours.   NIFEdipine 30 MG 24 hr tablet Commonly known as: ADALAT CC Take 1 tablet (30 mg total) by mouth daily.   oxyCODONE 5 MG immediate release tablet Commonly known as: Oxy IR/ROXICODONE Take 1 tablet (5 mg total) by mouth every 6 (six) hours as needed for severe pain.   prenatal multivitamin Tabs tablet Take 1 tablet by mouth daily at 12 noon.         Discharge home in stable condition Infant Feeding: Breast Infant Disposition:home with mother Discharge instruction: per After Visit Summary and Postpartum booklet. Activity: Advance as tolerated. Pelvic rest for 6 weeks.  Diet: routine diet Future Appointments: Future Appointments  Date Time Provider [place]  11/11/2021  1:30 PM [facility] NURSE [facility]  12/02/2021  1:15 PM Radene Gunning, MD [facility]   Follow up Visit: Message sent to [facility] by Dr. Cy Blamer on 11/02/2021 Please schedule this patient for a In person postpartum visit in 4 weeks with the following provider: MD. Additional Postpartum F/U:BP check 1 week  High risk pregnancy complicated by: HTN Delivery mode:  Vaginal, Spontaneous  Anticipated Birth Control:   S/p PP BTL  Renard Matter, MD, MPH OB Fellow, Faculty Practice

## 2021-11-02 NOTE — Anesthesia Procedure Notes (Signed)
Procedure Name: Intubation Date/Time: 11/02/2021 1:30 PM Performed by: Adalberto Ill, CRNA Pre-anesthesia Checklist: Patient identified, Emergency Drugs available, Suction available, Patient being monitored and Timeout performed Patient Re-evaluated:Patient Re-evaluated prior to induction Oxygen Delivery Method: Circle system utilized Preoxygenation: Pre-oxygenation with 100% oxygen Induction Type: IV induction Laryngoscope Size: Miller and 2 Grade View: Grade I Tube type: Oral Tube size: 7.0 mm Number of attempts: 1 (brief atraumatic) Airway Equipment and Method: Stylet Placement Confirmation: ETT inserted through vocal cords under direct vision, positive ETCO2 and breath sounds checked- equal and bilateral Secured at: 22 cm Tube secured with: Tape Dental Injury: Teeth and Oropharynx as per pre-operative assessment

## 2021-11-02 NOTE — Progress Notes (Signed)
Christina May is a 31 y.o. [id number] at [redacted]w[redacted]d  admitted for induction of labor due to [place].  Subjective: Reports headache had gotten better with last dose of tylenol and rest. Now coming back. No blurry vision.   Objective: BP 124/78    Pulse 96    Temp 98 F (36.7 C) (Oral)    Resp 16    Ht 5\' 5"  (1.651 m)    Wt 90.2 kg    LMP 02/12/2021 (Exact Date)    SpO2 100%    BMI 33.10 kg/m  No intake/output data recorded. No intake/output data recorded.  FHT:  FHR: 130 bpm, variability: moderate,  accelerations:  Present,  decelerations:  Absent UC:   regular, every 1-3 minutes SVE:   Dilation: 4 Effacement (%): 60 Station: -3 Exam by:: Cy Blamer, MD  Labs: Lab Results  Component Value Date   WBC 13.6 (H) 11/01/2021   HGB 10.3 (L) 11/01/2021   HCT 33.6 (L) 11/01/2021   MCV 75.2 (L) 11/01/2021   PLT 167 11/01/2021    Assessment / Plan:  [id number] at [redacted]w[redacted]d  admitted for induction of labor due to gHTN.  Labor:  On pitocin for ~4 hrs. Continues to be 4 cm. Head well applied to cervix. AROMed during current check. Clear fluid slow trickle. Tolerated by fetus and patient. Fetal Wellbeing:  Category I Pain Control:  Labor support without medications I/D:   GBS neg   #gHTN BP now normotensive. Admission preE labs normal including normal PCR. Will repeat at 0500 given patient having intermittent HA. HA had previously resolved. At this time HA is coming back and will treat with Fiorecet (with 325 of Tylenol) as well as 625 of Tylenol. Continue to monitor for improvement and watch for signs of severe pre eclampsia including headache not getting better or other neurological symptoms.   Renard Matter 11/02/2021, 3:57 AM

## 2021-11-03 ENCOUNTER — Other Ambulatory Visit: Payer: Self-pay

## 2021-11-03 NOTE — Lactation Note (Signed)
This note was copied from a baby's chart. Lactation Consultation Note  Patient Name: Christina May LPFXT'K Date: 11/03/2021 Reason for consult: Follow-up assessment;Mother's request;Difficult latch;Early term 37-38.6wks;Breastfeeding assistance;RN request Age:29 hours  LC assisted getting a deeper latch with signs of milk transfer. Infant fed for about 14 min then switched to opposite breast.  Mom difficulty latching some of feedings and requested manual pump so she can offer more volume and empty breast when infant not latching.   Plan 1. To feed based on cues 8-12x 24hr period.  2. Mom to offer breasts and look for signs of milk transfer with breast compression.  3. Post pump for comfort q 3hrs for 10 min. Mom feeling full, no signs of engorgement 4 Mom to supplement EBM via pace bottle feeding and slow flow nipple  BF supplementation guide provided and reviewed.  All questions answered at the end of the visit.   Maternal Data Has patient been taught Hand Expression?: Yes  Feeding Mother's Current Feeding Choice: Breast Milk  LATCH Score Latch: Grasps breast easily, tongue down, lips flanged, rhythmical sucking.  Audible Swallowing: Spontaneous and intermittent  Type of Nipple: Everted at rest and after stimulation  Comfort (Breast/Nipple): Soft / non-tender  Hold (Positioning): Assistance needed to correctly position infant at breast and maintain latch.  LATCH Score: 9   Lactation Tools Discussed/Used Tools: Pump;Flanges Flange Size: 27 Breast pump type: Double-Electric Breast Pump Pump Education: Setup, frequency, and cleaning;Milk Storage Reason for Pumping: offer more volume Pumping frequency: post puming after latching q 3hrs for 10 min each breast  Interventions Interventions: Assisted with latch;Breast feeding basics reviewed;Skin to skin;Breast massage;Hand express;Breast compression;Adjust position;Support pillows;Position options;Expressed milk;Hand  pump;Education;Pace feeding;Infant Driven Feeding Algorithm education  Discharge Pump: Manual;Personal  Consult Status Consult Status: Follow-up Date: 11/04/21 Follow-up type: In-patient    Christina May  Christina May 11/03/2021, 2:36 PM

## 2021-11-03 NOTE — Lactation Note (Signed)
This note was copied from a baby's chart. Lactation Consultation Note RN reported mom having difficulty latching baby. Room dark. Encouraged mom to turn on lights so she can see baby and what she's doing. LC changed void to diaper for baby stimulation. Baby woke up well and w/some stimulation baby started suckling at breast. Mom stated he keeps stopping. LC explained babies has to take rest breaks during feeding time. It's hard work. If he hasn't started suckling w/in 15 seconds then rub him, talk to him, compress breast to stimulate him to feed. Baby feeding well when Mingus left.  Patient Name: Christina May [id number] Date: 11/03/2021 Reason for consult: Follow-up assessment;Early term 37-38.6wks Age:74 hours  Maternal Data Has patient been taught Hand Expression?: Yes Does the patient have breastfeeding experience prior to this delivery?: Yes How long did the patient breastfeed?: 8 1/2 month 6 months  Feeding    LATCH Score Latch: Grasps breast easily, tongue down, lips flanged, rhythmical sucking.  Audible Swallowing: A few with stimulation  Type of Nipple: Everted at rest and after stimulation  Comfort (Breast/Nipple): Soft / non-tender  Hold (Positioning): Assistance needed to correctly position infant at breast and maintain latch.  LATCH Score: 8   Lactation Tools Discussed/Used    Interventions    Discharge    Consult Status Consult Status: Follow-up Date: 11/03/21 Follow-up type: In-patient    Theodoro Kalata 11/03/2021, 2:57 AM

## 2021-11-03 NOTE — Progress Notes (Signed)
POSTPARTUM PROGRESS NOTE  Subjective: Nelissa Bolduc is a 31 y.o. [id number] PPD#1s/p SVD at [redacted]w[redacted]d.  She reports she doing well. No acute events overnight. She denies any problems with ambulating, voiding or po intake. Denies nausea or vomiting. She has  passed flatus. Pain is moderately controlled. Having some significant cramping with breastfeeding.   Lochia is appropriate.  Objective: Blood pressure 106/76, pulse 81, temperature 97.9 F (36.6 C), temperature source Oral, resp. rate 16, height 5\' 5"  (1.651 m), weight 90.2 kg, last menstrual period 02/12/2021, SpO2 100 %, unknown if currently breastfeeding.  Physical Exam:  General: alert, cooperative and no distress Chest: no respiratory distress Abdomen: soft, non-tender  Uterine Fundus: firm, appropriately tender Extremities: No calf swelling or tenderness   no edema  Recent Labs    11/01/21 2040 11/02/21 0710  HGB 10.3* 10.0*  HCT 33.6* 31.9*    Assessment/Plan: Desirre Eickhoff is a 31 y.o. O1I1030  PPD#1s/p SVD at [redacted]w[redacted]d.   Routine Postpartum Care: Doing well, pain is sometime significant (cramping and at site of BTL). Exam appropriate. PRN Oxycodone ordered.  -- Continue routine care, lactation support  -- Contraception: BTL done -- Feeding: breast  Dispo: Plan for discharge PPD# per patient preference.  Renard Matter, MD, MPH OB Fellow, [facility] for [facility]

## 2021-11-04 ENCOUNTER — Encounter: Payer: Self-pay | Admitting: *Deleted

## 2021-11-04 ENCOUNTER — Other Ambulatory Visit (HOSPITAL_COMMUNITY): Payer: Self-pay

## 2021-11-04 MED ORDER — FUROSEMIDE 20 MG PO TABS
20.0000 mg | ORAL_TABLET | Freq: Every day | ORAL | 0 refills | Status: DC
  Filled 2021-11-04: qty 4, 4d supply, fill #0

## 2021-11-04 MED ORDER — ACETAMINOPHEN 325 MG PO TABS
650.0000 mg | ORAL_TABLET | ORAL | 0 refills | Status: DC | PRN
  Filled 2021-11-04: qty 60, 5d supply, fill #0

## 2021-11-04 MED ORDER — IBUPROFEN 600 MG PO TABS
600.0000 mg | ORAL_TABLET | Freq: Four times a day (QID) | ORAL | 0 refills | Status: DC
  Filled 2021-11-04: qty 30, 8d supply, fill #0

## 2021-11-04 MED ORDER — NIFEDIPINE ER 30 MG PO TB24
30.0000 mg | ORAL_TABLET | Freq: Every day | ORAL | 0 refills | Status: DC
  Filled 2021-11-04: qty 60, 60d supply, fill #0

## 2021-11-04 MED ORDER — OXYCODONE HCL 5 MG PO TABS
5.0000 mg | ORAL_TABLET | Freq: Four times a day (QID) | ORAL | 0 refills | Status: DC | PRN
Start: 2021-11-04 — End: 2021-12-02
  Filled 2021-11-04: qty 5, 2d supply, fill #0

## 2021-11-04 NOTE — Lactation Note (Signed)
This note was copied from a baby's chart. Lactation Consultation Note  Patient Name: Christina May ANVBT'Y Date: 11/04/2021 Reason for consult: Follow-up assessment Age:29 hours  P4, Mother's milk is transitioning. She pumped 60 ml this morning to relieve engorgement.  Mother knows to apply ice and limit pumping sessions to 5-7 min for comfort. Observed baby latch with ease. Lips flanged.  Feeding Mother's Current Feeding Choice: Breast Milk  LATCH Score Latch: Grasps breast easily, tongue down, lips flanged, rhythmical sucking.  Audible Swallowing: A few with stimulation  Type of Nipple: Everted at rest and after stimulation  Comfort (Breast/Nipple): Soft / non-tender  Hold (Positioning): No assistance needed to correctly position infant at breast.  LATCH Score: 9   Lactation Tools Discussed/Used  DEBP  Interventions Interventions: Breast feeding basics reviewed;Education  Discharge Discharge Education: Engorgement and breast care;Warning signs for feeding baby Pump: Personal;DEBP ([place])  Consult Status Consult Status: Complete Date: 11/04/21    Vivianne Master [facility] 11/04/2021, 9:06 AM

## 2021-11-11 ENCOUNTER — Ambulatory Visit: Payer: Medicaid Other

## 2021-11-11 ENCOUNTER — Other Ambulatory Visit: Payer: Self-pay

## 2021-11-16 ENCOUNTER — Telehealth (HOSPITAL_COMMUNITY): Payer: Self-pay | Admitting: *Deleted

## 2021-11-16 NOTE — Telephone Encounter (Signed)
Attempted hospital discharge follow-up call. Left message for patient to return RN call. Erline Levine, RN, 11/16/21, [phone]

## 2021-11-17 ENCOUNTER — Ambulatory Visit (HOSPITAL_COMMUNITY): Payer: Medicaid Other

## 2021-11-18 ENCOUNTER — Other Ambulatory Visit: Payer: Self-pay

## 2021-11-18 ENCOUNTER — Ambulatory Visit (INDEPENDENT_AMBULATORY_CARE_PROVIDER_SITE_OTHER): Payer: Medicaid Other | Admitting: *Deleted

## 2021-11-18 ENCOUNTER — Ambulatory Visit: Payer: Medicaid Other

## 2021-11-18 ENCOUNTER — Telehealth: Payer: Self-pay | Admitting: *Deleted

## 2021-11-18 VITALS — BP 122/94 | HR 76 | Ht 65.0 in | Wt 174.3 lb

## 2021-11-18 DIAGNOSIS — Z0130 Encounter for examination of blood pressure without abnormal findings: Secondary | ICD-10-CM

## 2021-11-18 NOTE — Telephone Encounter (Signed)
[facility] DNKA bp check for this am 0920. I called her and notified her and asked if we can reschedule. She agreed to 1:00 today. Tatyanna Cronk,RN

## 2021-11-18 NOTE — Patient Instructions (Signed)
Your medicine you should be taking is nifedipine

## 2021-11-18 NOTE — Progress Notes (Signed)
Here for blood pressure check. Had vaginal delivery 11/02/21 and GHTN. Dc home on procardia but states she did not know what it was for and did not take it. States she has been having headaches, none today or yesterday. BP today 131/88. Repeat 122/94. Advised she should be taking her nifedipine and reviewed her postpartum appointment with her of 12/02/21. She voices understanding. Advised to call before then if any issues.  Moishy Laday,RN

## 2021-11-21 ENCOUNTER — Inpatient Hospital Stay (HOSPITAL_COMMUNITY): Admit: 2021-11-21 | Payer: Self-pay

## 2021-11-29 ENCOUNTER — Telehealth: Payer: Self-pay

## 2021-11-29 NOTE — Telephone Encounter (Signed)
Care manager from [place] left VM stating patient is experiencing yellow/orange vaginal discharge with foul odor. Patient is approx 4 weeks PP and will be in office Monday, 12/02/21. Will address at appt.

## 2021-11-29 NOTE — Progress Notes (Signed)
[facility] Partum Visit Note  Christina May is a 31 y.o. [phone] female who presents for a postpartum visit. She is 4 weeks postpartum following a normal spontaneous vaginal delivery.  I have fully reviewed the prenatal and intrapartum course. The delivery was at 37.4 gestational weeks.  Anesthesia: none. Postpartum course has been . Baby is doing well - she is colicky. Baby is feeding by breast. Bleeding no bleeding. Bowel function is normal. Bladder function is normal. Patient is not sexually active. Contraception method is tubal ligation. Postpartum depression screening: negative.   The pregnancy intention screening data noted above was reviewed. Potential methods of contraception were discussed. The patient elected to proceed with No data recorded.   Edinburgh Postnatal Depression Scale - 12/02/21 1323       Edinburgh Postnatal Depression Scale:  In the Past 7 Days   I have been able to laugh and see the funny side of things. 0    I have looked forward with enjoyment to things. 0    I have blamed myself unnecessarily when things went wrong. 0    I have been anxious or worried for no good reason. 0    I have felt scared or panicky for no good reason. 0    Things have been getting on top of me. 0    I have been so unhappy that I have had difficulty sleeping. 0    I have felt sad or miserable. 0    I have been so unhappy that I have been crying. 0    The thought of harming myself has occurred to me. 0    Edinburgh Postnatal Depression Scale Total 0             Health Maintenance Due  Topic Date Due   PAP-Cervical Cytology Screening  Never done   PAP SMEAR-Modifier  Never done    The following portions of the patient's history were reviewed and updated as appropriate: allergies, current medications, past family history, past medical history, past social history, past surgical history, and problem list.  Review of Systems Pertinent items are noted in HPI.  Objective:  BP 116/82     Pulse 80    Ht 5\' 5"  (1.651 m)    Wt 174 lb 8 oz (79.2 kg)    LMP  (LMP Unknown)    Breastfeeding Yes    BMI 29.04 kg/m    General:  alert, cooperative, and no distress   Breasts:  not indicated  Lungs: clear to auscultation bilaterally  Heart:  regular rate and rhythm  Abdomen: soft, non-tender; bowel sounds normal; no masses,  no organomegaly   Wound well approximated incision  GU exam:  not indicated       Assessment:    There are no diagnoses linked to this encounter.  4w postpartum exam.   Plan:   Essential components of care per ACOG recommendations:  1.  Mood and well being: Patient with negative depression screening today. Reviewed local resources for support.  - Patient tobacco use? No.   - hx of drug use? No.    2. Infant care and feeding:  -Patient currently breastmilk feeding? Yes. Reviewed importance of draining breast regularly to support lactation.  -Social determinants of health (SDOH) reviewed in EPIC. No concerns  3. Sexuality, contraception and birth spacing - Patient does not want a pregnancy in the next year.  Desired family size is 4 children.  - BTL  - Discussed birth spacing of  18 months  4. Sleep and fatigue -Encouraged family/partner/community support of 4 hrs of uninterrupted sleep to help with mood and fatigue  5. Physical Recovery  - Discussed patients delivery and complications. She describes her labor as good. - Patient had a Vaginal, no problems at delivery.  - Patient has urinary incontinence? No. - Patient is safe to resume physical and sexual activity  6.  Health Maintenance - HM due items addressed Yes - Last pap smear 05/2021 wnl in Care Everywhere -Breast Cancer screening indicated? No.   7. Chronic Disease/Pregnancy Condition follow up: None  - PCP follow up  Radene Gunning, [place] for [facility], [place]

## 2021-12-02 ENCOUNTER — Other Ambulatory Visit (HOSPITAL_COMMUNITY)
Admission: RE | Admit: 2021-12-02 | Discharge: 2021-12-02 | Disposition: A | Discharge status: Home / Self Care | Payer: Medicaid Other | Source: Ambulatory Visit | Attending: Obstetrics and Gynecology | Admitting: Obstetrics and Gynecology

## 2021-12-02 ENCOUNTER — Encounter: Payer: Self-pay | Admitting: Obstetrics and Gynecology

## 2021-12-02 ENCOUNTER — Other Ambulatory Visit: Payer: Self-pay

## 2021-12-02 ENCOUNTER — Ambulatory Visit (INDEPENDENT_AMBULATORY_CARE_PROVIDER_SITE_OTHER): Payer: Medicaid Other | Admitting: Obstetrics and Gynecology

## 2021-12-02 DIAGNOSIS — N898 Other specified noninflammatory disorders of vagina: Secondary | ICD-10-CM | POA: Insufficient documentation

## 2021-12-03 LAB — CERVICOVAGINAL ANCILLARY ONLY
Bacterial Vaginitis (gardnerella): POSITIVE — AB
Candida Glabrata: NEGATIVE
Candida Vaginitis: NEGATIVE
Chlamydia: NEGATIVE
Comment: NEGATIVE
Comment: NEGATIVE
Comment: NEGATIVE
Comment: NEGATIVE
Comment: NEGATIVE
Comment: NORMAL
Neisseria Gonorrhea: NEGATIVE
Trichomonas: NEGATIVE

## 2021-12-03 MED ORDER — METRONIDAZOLE 500 MG PO TABS: 500.0000 mg | ORAL_TABLET | Freq: Two times a day (BID) | ORAL | 0 refills | Status: DC

## 2021-12-03 NOTE — Addendum Note (Signed)
Addended by: Radene Gunning A on: 12/03/2021 01:28 PM   Modules accepted: Orders

## 2021-12-31 NOTE — Progress Notes (Signed)
Patient was assessed and managed by nursing staff during this encounter. I have reviewed the chart and agree with the documentation and plan. I have also made any necessary editorial changes.  Scheryl Darter, MD 12/31/2021 1:40 PM

## 2022-02-19 ENCOUNTER — Emergency Department (HOSPITAL_BASED_OUTPATIENT_CLINIC_OR_DEPARTMENT_OTHER)
Admission: EM | Admit: 2022-02-19 | Discharge: 2022-02-19 | Disposition: A | Discharge status: Home / Self Care | Payer: Medicaid Other | Attending: Emergency Medicine | Admitting: Emergency Medicine

## 2022-02-19 ENCOUNTER — Other Ambulatory Visit: Payer: Self-pay

## 2022-02-19 ENCOUNTER — Encounter (HOSPITAL_BASED_OUTPATIENT_CLINIC_OR_DEPARTMENT_OTHER): Payer: Self-pay | Admitting: Emergency Medicine

## 2022-02-19 DIAGNOSIS — Z20822 Contact with and (suspected) exposure to covid-19: Secondary | ICD-10-CM | POA: Diagnosis not present

## 2022-02-19 DIAGNOSIS — J0100 Acute maxillary sinusitis, unspecified: Secondary | ICD-10-CM | POA: Diagnosis not present

## 2022-02-19 DIAGNOSIS — H9203 Otalgia, bilateral: Secondary | ICD-10-CM | POA: Diagnosis present

## 2022-02-19 LAB — RESP PANEL BY RT-PCR (FLU A&B, COVID) ARPGX2
Influenza A by PCR: NEGATIVE
Influenza B by PCR: NEGATIVE
SARS Coronavirus 2 by RT PCR: NEGATIVE

## 2022-02-19 MED ORDER — FLUTICASONE PROPIONATE 50 MCG/ACT NA SUSP: 2.0000 | Freq: Every day | NASAL | 2 refills | Status: DC

## 2022-02-19 NOTE — ED Provider Notes (Signed)
?[place] EMERGENCY DEPT ?Provider Note ? ? ?CSN: [id number] ?Arrival date & time: 02/19/22  1514 ? ?  ? ?History ? ?Chief Complaint  ?Patient presents with  ? Otalgia  ? ? ?Christina May is a 31 y.o. female with noncontributory past medical history who presents with concern for bilateral ear pain, sore throat, burning, irritation of the eyes, and nasal congestion, yellow-brown mucus for the last 3 days.  She has not had any fever, chills.  She reports negative COVID test at home x2.  She denies nausea, vomiting, abdominal pain, diarrhea.  She reports that she has not tried anything over-the-counter as of yet.  She reports that she has 2 young kids at home, may have caught something from them. ? ? ?Otalgia ?Associated symptoms: congestion   ? ?  ? ?Home Medications ?Prior to Admission medications   ?Medication Sig Start Date End Date Taking? Authorizing Provider  ?fluticasone (FLONASE) 50 MCG/ACT nasal spray Place 2 sprays into both nostrils daily. 02/19/22  Yes Corry Ihnen H, PA-C  ?metroNIDAZOLE (FLAGYL) 500 MG tablet Take 1 tablet (500 mg total) by mouth 2 (two) times daily. 12/03/21   Radene Gunning, MD  ?NIFEdipine (ADALAT CC) 30 MG 24 hr tablet Take 1 tablet (30 mg total) by mouth daily. 11/04/21 01/03/22  Renard Matter, MD  ?Prenatal Vit-Fe Fumarate-FA (PRENATAL MULTIVITAMIN) TABS tablet Take 1 tablet by mouth daily at 12 noon.    [provider]  ?   ? ?Allergies    ?Patient has no known allergies.   ? ?Review of Systems   ?Review of Systems  ?HENT:  Positive for congestion and ear pain.   ?All other systems reviewed and are negative. ? ?Physical Exam ?Updated Vital Signs ?BP 119/78   Pulse 92   Temp 98.2 ?F (36.8 ?C) (Oral)   Resp 16   SpO2 99%  ?Physical Exam ?Vitals and nursing note reviewed.  ?Constitutional:   ?   General: She is not in acute distress. ?   Appearance: Normal appearance.  ?HENT:  ?   Head: Normocephalic and atraumatic.  ?   Right Ear: Tympanic membrane, ear  canal and external ear normal. There is no impacted cerumen.  ?   Left Ear: Tympanic membrane, ear canal and external ear normal. There is no impacted cerumen.  ?   Nose: Congestion and rhinorrhea present.  ?   Mouth/Throat:  ?   Mouth: Mucous membranes are moist.  ?   Comments: Overall normal appearance of oropharynx with minimal erythema at the posterior oropharynx.  Uvula is midline.  Tonsils are removed.  No evidence of abscess or asymmetric swelling noted. ?Eyes:  ?   General:     ?   Right eye: No discharge.     ?   Left eye: No discharge.  ?Cardiovascular:  ?   Rate and Rhythm: Normal rate and regular rhythm.  ?Pulmonary:  ?   Effort: Pulmonary effort is normal. No respiratory distress.  ?Musculoskeletal:     ?   General: No deformity.  ?   Cervical back: Neck supple. No rigidity.  ?Skin: ?   General: Skin is warm and dry.  ?Neurological:  ?   Mental Status: She is alert and oriented to person, place, and time.  ?Psychiatric:     ?   Mood and Affect: Mood normal.     ?   Behavior: Behavior normal.  ? ? ?ED Results / Procedures / Treatments   ?Labs ?(all labs ordered are  listed, but only abnormal results are displayed) ?Labs Reviewed  ?RESP PANEL BY RT-PCR (FLU A&B, COVID) ARPGX2  ? ? ?EKG ?None ? ?Radiology ?No results found. ? ?Procedures ?Procedures  ? ? ?Medications Ordered in ED ?Medications - No data to display ? ?ED Course/ Medical Decision Making/ A&P ?  ?                        ?Medical Decision Making ? ?This is an overall well-appearing patient who has physical exam findings which are consistent with congestion, sinus infection, sinus pressure presents with concern for ear pain, mucus, sinus pain and pressure, as well as sore throat.  Members differential diagnosis includes COVID, flu, other infectious etiology, viral sinusitis, bacterial sinusitis, versus more severe ENT emergency, peritonsillar abscess, airway compromise versus other.  Based on my physical exam, duration of patient's symptoms I  have low clinical concern for acute bacterial sinusitis.  Patient is afebrile, nontachycardic, vital signs stable.  She has absent tonsils, no evidence of posterior oropharynx abscess, exudate, uvula is midline.  Her clinical condition today is consistent with viral sinusitis.  We will test for COVID, flu, however patient can check these results at home as it would not influence my treatment at this time.  Encouraged Flonase, over-the-counter decongestants, Mucinex, rest, Nettie pot.  Patient encouraged to follow-up as needed if symptoms worsen instead of improve.  Patient discharged in stable condition at this time, return precautions given. ?Final Clinical Impression(s) / ED Diagnoses ?Final diagnoses:  ?Acute maxillary sinusitis, recurrence not specified  ? ? ?Rx / DC Orders ?ED Discharge Orders   ? ?      Ordered  ?  fluticasone (FLONASE) 50 MCG/ACT nasal spray  Daily       ? 02/19/22 1622  ? ?  ?  ? ?  ? ? ?  ?Anselmo Pickler, PA-C ?02/19/22 1627 ? ?  ?Sherwood Gambler, MD ?02/19/22 1721 ? ?

## 2022-02-19 NOTE — Discharge Instructions (Addendum)
As we discussed, with no fever, symptoms ongoing for 3 days, I have low clinical suspicion for bacterial infection of your sinuses at this time.  I recommend some over-the-counter decongestant medication including Sudafed or combination sinus infection relief which often contain Tylenol, guaifenesin, phenylephrine, and usually require your ID to purchase over-the-counter.  You can additionally use Mucinex, as well as a Nettie pot to perform a sinus rinse.  Recommend ibuprofen, Tylenol to help with the ear pain, pressure that you are experiencing.  Recommend plenty of fluids, rest. Additionally please use the flonase I am prescribing for relief of nasal congestion. ? ?It is possible that your infection will get worse instead of better if it is caused by bacteria, I have low suspicion for that at this time, however if you do develop fever, your symptoms worsen despite all of the treatment as above please return to the emergency department or an urgent care for further evaluation and treatment, and antibiotics. ?

## 2022-02-19 NOTE — ED Triage Notes (Signed)
Bilateral ears hurting, throat sore, eyes burning for 3 days, covid -. Mucus dark brown,thick. Very congested in triage. Denies fever. ?

## 2022-02-25 ENCOUNTER — Telehealth: Payer: Medicaid Other | Admitting: Physician Assistant

## 2022-02-25 DIAGNOSIS — J069 Acute upper respiratory infection, unspecified: Secondary | ICD-10-CM | POA: Diagnosis not present

## 2022-02-25 DIAGNOSIS — B9689 Other specified bacterial agents as the cause of diseases classified elsewhere: Secondary | ICD-10-CM

## 2022-02-25 MED ORDER — AMOXICILLIN-POT CLAVULANATE 875-125 MG PO TABS: 1.0000 | ORAL_TABLET | Freq: Two times a day (BID) | ORAL | 0 refills | Status: DC

## 2022-02-25 NOTE — Progress Notes (Signed)
I have spent 5 minutes in review of e-visit questionnaire, review and updating patient chart, medical decision making and response to patient.   Norton Bivins Cody Chevonne Bostrom, PA-C    

## 2022-02-25 NOTE — Progress Notes (Signed)

## 2022-05-14 ENCOUNTER — Ambulatory Visit: Payer: Medicaid Other

## 2022-05-19 ENCOUNTER — Other Ambulatory Visit: Payer: Self-pay

## 2022-05-19 ENCOUNTER — Ambulatory Visit (INDEPENDENT_AMBULATORY_CARE_PROVIDER_SITE_OTHER): Payer: Medicaid Other | Admitting: General Practice

## 2022-05-19 ENCOUNTER — Other Ambulatory Visit (HOSPITAL_COMMUNITY)
Admission: RE | Admit: 2022-05-19 | Discharge: 2022-05-19 | Disposition: A | Discharge status: Home / Self Care | Payer: Medicaid Other | Source: Ambulatory Visit | Attending: Family Medicine | Admitting: Family Medicine

## 2022-05-19 VITALS — BP 115/83 | HR 82 | Ht 65.0 in | Wt 183.0 lb

## 2022-05-19 DIAGNOSIS — N898 Other specified noninflammatory disorders of vagina: Secondary | ICD-10-CM | POA: Insufficient documentation

## 2022-05-19 DIAGNOSIS — Z113 Encounter for screening for infections with a predominantly sexual mode of transmission: Secondary | ICD-10-CM | POA: Diagnosis present

## 2022-05-19 NOTE — Progress Notes (Signed)
Patient presents to office today requesting self swab due to noticing a thicker discharge, some itching, & odor before recent menstrual cycle. Patient would also like to be tested for STDs. She was instructed in self swab and specimen collected. Blood work also collected. Discussed with patient results will be back in 24-48 hours and available via mychart. Advised patient we will send any needed prescriptions to her pharmacy at that time.   Koren Bound RN BSN 05/19/22

## 2022-05-20 LAB — CERVICOVAGINAL ANCILLARY ONLY
Bacterial Vaginitis (gardnerella): POSITIVE — AB
Candida Glabrata: NEGATIVE
Candida Vaginitis: NEGATIVE
Chlamydia: NEGATIVE
Comment: NEGATIVE
Comment: NEGATIVE
Comment: NEGATIVE
Comment: NEGATIVE
Comment: NEGATIVE
Comment: NORMAL
Neisseria Gonorrhea: NEGATIVE
Trichomonas: NEGATIVE

## 2022-05-20 LAB — HEPATITIS B SURFACE ANTIGEN: Hepatitis B Surface Ag: NEGATIVE

## 2022-05-20 LAB — HEPATITIS C ANTIBODY: Hep C Virus Ab: NONREACTIVE

## 2022-05-20 LAB — SYPHILIS: RPR W/REFLEX TO RPR TITER AND TREPONEMAL ANTIBODIES, TRADITIONAL SCREENING AND DIAGNOSIS ALGORITHM: RPR Ser Ql: NONREACTIVE

## 2022-05-20 LAB — HIV ANTIBODY (ROUTINE TESTING W REFLEX): HIV Screen 4th Generation wRfx: NONREACTIVE

## 2022-05-21 ENCOUNTER — Telehealth: Payer: Self-pay | Admitting: General Practice

## 2022-05-21 DIAGNOSIS — N760 Acute vaginitis: Secondary | ICD-10-CM

## 2022-05-21 MED ORDER — METRONIDAZOLE 500 MG PO TABS: 500.0000 mg | ORAL_TABLET | Freq: Two times a day (BID) | ORAL | 0 refills | Status: DC

## 2022-05-21 NOTE — Telephone Encounter (Signed)
Patient called into front office stating she saw her test results in mychart showing BV and would like some medicine sent to her pharmacy. Told patient we would send flagyl in and reviewed medication instructions. Patient verbalized understanding. Flagyl sent to pharmacy per protocol.

## 2022-10-01 ENCOUNTER — Encounter: Payer: Self-pay | Admitting: Obstetrics and Gynecology

## 2022-10-01 ENCOUNTER — Telehealth: Payer: Medicaid Other | Admitting: Physician Assistant

## 2022-10-01 DIAGNOSIS — J069 Acute upper respiratory infection, unspecified: Secondary | ICD-10-CM | POA: Diagnosis not present

## 2022-10-01 MED ORDER — BENZONATATE 100 MG PO CAPS: 100.0000 mg | ORAL_CAPSULE | Freq: Three times a day (TID) | ORAL | 0 refills | Status: AC | PRN

## 2022-10-01 MED ORDER — FLUTICASONE PROPIONATE 50 MCG/ACT NA SUSP: 2.0000 | Freq: Every day | NASAL | 0 refills | Status: AC

## 2022-10-01 NOTE — Patient Instructions (Signed)
  Orie Rout, thank you for joining Leeanne Rio, PA-C for today's virtual visit.  While this provider is not your primary care provider (PCP), if your PCP is located in our provider database this encounter information will be shared with them immediately following your visit.   [facility] account gives you access to today's visit and all your visits, tests, and labs performed at [facility] " click here if you don't have a [facility] account or go to [email or web address]  Consent: (Patient) Chrishonda Hesch provided verbal consent for this virtual visit at the beginning of the encounter.  Current Medications:  Current Outpatient Medications:    fluticasone (FLONASE) 50 MCG/ACT nasal spray, Place 2 sprays into both nostrils daily., Disp: 11.1 mL, Rfl: 2   metroNIDAZOLE (FLAGYL) 500 MG tablet, Take 1 tablet (500 mg total) by mouth 2 (two) times daily., Disp: 14 tablet, Rfl: 0   Medications ordered in this encounter:  No orders of the defined types were placed in this encounter.    *If you need refills on other medications prior to your next appointment, please contact your pharmacy*  Follow-Up: Call back or seek an in-person evaluation if the symptoms worsen or if the condition fails to improve as anticipated.  [place] [phone]  Other Instructions Please keep hydrated and try to get plenty of rest. Start a saline nasal rinse and use the Flonase as directed. Mucinex-DM OTC for congestion and cough. Ok to take the prescribed Tessalon along with this.  Follow-up if symptoms are not resolving.    If you have been instructed to have an in-person evaluation today at a local Urgent Care facility, please use the link below. It will take you to a list of all of our available [facility], including address, phone number and hours of operation. Please do not delay care.  [facility]  If you or a family  member do not have a primary care provider, use the link below to schedule a visit and establish care. When you choose a [facility] primary care physician or advanced practice provider, you gain a long-term partner in health. Find a Primary Care Provider  Learn more about [facility]'s in-office and virtual care options: [facility]

## 2022-10-01 NOTE — Progress Notes (Signed)
Virtual Visit Consent   Christina May, you are scheduled for a virtual visit with a [facility] provider today. Just as with appointments in the office, your consent must be obtained to participate. Your consent will be active for this visit and any virtual visit you may have with one of our providers in the next 365 days. If you have a MyChart account, a copy of this consent can be sent to you electronically.  As this is a virtual visit, video technology does not allow for your provider to perform a traditional examination. This may limit your provider's ability to fully assess your condition. If your provider identifies any concerns that need to be evaluated in person or the need to arrange testing (such as labs, EKG, etc.), we will make arrangements to do so. Although advances in technology are sophisticated, we cannot ensure that it will always work on either your end or our end. If the connection with a video visit is poor, the visit may have to be switched to a telephone visit. With either a video or telephone visit, we are not always able to ensure that we have a secure connection.  By engaging in this virtual visit, you consent to the provision of healthcare and authorize for your insurance to be billed (if applicable) for the services provided during this visit. Depending on your insurance coverage, you may receive a charge related to this service.  I need to obtain your verbal consent now. Are you willing to proceed with your visit today? Christina May has provided verbal consent on 10/01/2022 for a virtual visit (video or telephone). Leeanne Rio, [place]  Date: 10/01/2022 12:19 PM  Virtual Visit via Video Note   I, Leeanne Rio, connected with  Christina May  ([id number], 05-15-1991) on 10/01/22 at 11:45 AM EST by a video-enabled telemedicine application and verified that I am speaking with the correct person using two identifiers.  Location: Patient: Virtual Visit Location  Patient: Home Provider: Virtual Visit Location Provider: Home Office   I discussed the limitations of evaluation and management by telemedicine and the availability of in person appointments. The patient expressed understanding and agreed to proceed.    History of Present Illness: Christina May is a 31 y.o. who identifies as a female who was assigned female at birth, and is being seen today for URI symptoms starting Monday with head congestion, sore throat, cough and ear pressure. Today ears are improved as is throat. Still with head congestion and HA. Son with similar symptoms this past week but is better. Took home COVID test which was negative.  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  Resting comfortably at home.  Head is normocephalic, atraumatic.  No labored breathing. Speech is clear and  coherent with logical content.  Patient is alert and oriented at baseline.   Assessment and Plan: 1. Viral URI with cough - benzonatate (TESSALON) 100 MG capsule; Take 1 capsule (100 mg total) by mouth 3 (three) times daily as needed for cough.  Dispense: 30 capsule; Refill: 0 - fluticasone (FLONASE) 50 MCG/ACT nasal  spray; Place 2 sprays into both nostrils daily.  Dispense: 16 g; Refill: 0  COVID negative. 2.5 days of mild symptoms. Supportive measures and OTC medications reviewed. Flonase and Tessalon per orders. Work note provided.  Follow Up Instructions: I discussed the assessment and treatment plan with the patient. The patient was provided an opportunity to ask questions and all were answered. The patient agreed with the plan and demonstrated an understanding of the instructions.  A copy of instructions were sent to the patient via MyChart unless otherwise noted below.   The patient was advised to call back or seek an in-person evaluation if the symptoms worsen or if the condition fails to improve as anticipated.  Time:  I spent 10 minutes with the patient via telehealth technology discussing the above problems/concerns.    Leeanne Rio, PA-C

## 2022-11-02 ENCOUNTER — Other Ambulatory Visit: Payer: Self-pay

## 2022-11-02 ENCOUNTER — Encounter (HOSPITAL_COMMUNITY): Payer: Self-pay | Admitting: Emergency Medicine

## 2022-11-02 ENCOUNTER — Emergency Department (HOSPITAL_COMMUNITY): Payer: Medicaid Other

## 2022-11-02 ENCOUNTER — Emergency Department (HOSPITAL_COMMUNITY)
Admission: EM | Admit: 2022-11-02 | Discharge: 2022-11-03 | Disposition: A | Discharge status: Home / Self Care | Payer: Medicaid Other | Attending: Emergency Medicine | Admitting: Emergency Medicine

## 2022-11-02 DIAGNOSIS — Z20822 Contact with and (suspected) exposure to covid-19: Secondary | ICD-10-CM | POA: Insufficient documentation

## 2022-11-02 DIAGNOSIS — R519 Headache, unspecified: Secondary | ICD-10-CM | POA: Diagnosis present

## 2022-11-02 DIAGNOSIS — J069 Acute upper respiratory infection, unspecified: Secondary | ICD-10-CM | POA: Diagnosis not present

## 2022-11-02 DIAGNOSIS — I10 Essential (primary) hypertension: Secondary | ICD-10-CM | POA: Diagnosis not present

## 2022-11-02 DIAGNOSIS — E871 Hypo-osmolality and hyponatremia: Secondary | ICD-10-CM | POA: Diagnosis not present

## 2022-11-02 DIAGNOSIS — R000 Tachycardia, unspecified: Secondary | ICD-10-CM | POA: Diagnosis not present

## 2022-11-02 DIAGNOSIS — E876 Hypokalemia: Secondary | ICD-10-CM | POA: Diagnosis not present

## 2022-11-02 LAB — BASIC METABOLIC PANEL
Anion gap: 7 (ref 5–15)
BUN: 11 mg/dL (ref 6–20)
CO2: 23 mmol/L (ref 22–32)
Calcium: 8.7 mg/dL — ABNORMAL LOW (ref 8.9–10.3)
Chloride: 103 mmol/L (ref 98–111)
Creatinine, Ser: 0.63 mg/dL (ref 0.44–1.00)
GFR, Estimated: >60 mL/min (ref >60)
Glucose, Bld: 96 mg/dL (ref 70–99)
Potassium: 3.4 mmol/L — ABNORMAL LOW (ref 3.5–5.1)
Sodium: 133 mmol/L — ABNORMAL LOW (ref 135–145)

## 2022-11-02 LAB — I-STAT BETA HCG BLOOD, ED (MC, WL, AP ONLY): I-stat hCG, quantitative: <5 m[IU]/mL (ref <5)

## 2022-11-02 LAB — RESP PANEL BY RT-PCR (RSV, FLU A&B, COVID)  RVPGX2
Influenza A by PCR: NEGATIVE
Influenza B by PCR: NEGATIVE
Resp Syncytial Virus by PCR: NEGATIVE
SARS Coronavirus 2 by RT PCR: NEGATIVE

## 2022-11-02 LAB — CBC
HCT: 37.7 % (ref 36.0–46.0)
Hemoglobin: 11.6 g/dL — ABNORMAL LOW (ref 12.0–15.0)
MCH: 23.9 pg — ABNORMAL LOW (ref 26.0–34.0)
MCHC: 30.8 g/dL (ref 30.0–36.0)
MCV: 77.7 fL — ABNORMAL LOW (ref 80.0–100.0)
Platelets: 232 10*3/uL (ref 150–400)
RBC: 4.85 MIL/uL (ref 3.87–5.11)
RDW: 14.7 % (ref 11.5–15.5)
WBC: 8.5 10*3/uL (ref 4.0–10.5)
nRBC: 0 % (ref 0.0–0.2)

## 2022-11-02 LAB — TROPONIN I (HIGH SENSITIVITY): Troponin I (High Sensitivity): <2 ng/L (ref <18)

## 2022-11-02 NOTE — ED Provider Triage Note (Signed)
Emergency Medicine Provider Triage Evaluation Note  Christina May , a 32 y.o. female  was evaluated in triage.  Pt complains of headaches and some SOB.   Intermittent CP (sharp sensation)  Recent viral symptoms.   No recent surgeries, hospitalization, long travel, hemoptysis, estrogen containing OCP, cancer history.  No unilateral leg swelling.  No history of PE or VTE.   Review of Systems  Positive: CP/SOB Negative: Fever   Physical Exam  BP 128/89   Pulse (!) 113   Temp 98.4 F (36.9 C)   Resp (!) 22   Wt 81.6 kg   SpO2 99%   BMI 29.95 kg/m  Gen:   Awake, no distress   Resp:  Normal effort  MSK:   Moves extremities without difficulty  Other:    Medical Decision Making  Medically screening exam initiated at 11:04 PM.  Appropriate orders placed.  Christina May was informed that the remainder of the evaluation will be completed by another provider, this initial triage assessment does not replace that evaluation, and the importance of remaining in the ED until their evaluation is complete.  Workup started   Christina May, [place] 11/02/22 2306

## 2022-11-02 NOTE — ED Triage Notes (Signed)
Dizziness, substernal sharp CP that is 6/10 at worst but not present currently, HA, SOB while lying on L side, nasal congested starting this weekend.  States that she took a covid test at home, was negative. H/o htn during pregnancy

## 2022-11-03 LAB — TROPONIN I (HIGH SENSITIVITY): Troponin I (High Sensitivity): <2 ng/L (ref <18)

## 2022-11-03 MED ORDER — IPRATROPIUM-ALBUTEROL 0.5-2.5 (3) MG/3ML IN SOLN
3.0000 mL | Freq: Once | RESPIRATORY_TRACT | Status: AC
  Administered 2022-11-03: 3 mL via RESPIRATORY_TRACT
  Filled 2022-11-03: qty 3

## 2022-11-03 MED ORDER — POTASSIUM CHLORIDE CRYS ER 20 MEQ PO TBCR
40.0000 meq | EXTENDED_RELEASE_TABLET | Freq: Once | ORAL | Status: AC
  Administered 2022-11-03: 40 meq via ORAL
  Filled 2022-11-03: qty 2

## 2022-11-03 MED ORDER — DEXAMETHASONE 4 MG PO TABS
10.0000 mg | ORAL_TABLET | Freq: Once | ORAL | Status: AC
  Administered 2022-11-03: 10 mg via ORAL
  Filled 2022-11-03: qty 3

## 2022-11-03 NOTE — ED Provider Notes (Signed)
[facility] Provider Note   CSN: [id number] Arrival date & time: 11/02/22  2048     History  Chief Complaint  Patient presents with   Headache   Chest Pain    Christina May is a 32 y.o. female.  The history is provided by the patient.  Headache Chest Pain Associated symptoms: headache   She has history of hypertension and comes in with 2-day history of her chest feeling tight, shortness of breath, and a nonproductive cough.  She felt that her head was congested but has not had any rhinorrhea.  She denies any sore throat.  There has been no fever or sweats but she has had some chills.  There has been no nausea, vomiting, diarrhea.  She denies arthralgias or myalgias.  She actually had similar symptoms about 6 days ago which lasted about 2 days and resolved.  She had stopped taking her blood pressure medicine (nifedipine XL 30 mg), but resumed taking it and her symptoms seem to improve until 2 days ago when they came back.   Home Medications Prior to Admission medications   Medication Sig Start Date End Date Taking? Authorizing Provider  benzonatate (TESSALON) 100 MG capsule Take 1 capsule (100 mg total) by mouth 3 (three) times daily as needed for cough. 10/01/22   Brunetta Jeans, PA-C  fluticasone (FLONASE) 50 MCG/ACT nasal spray Place 2 sprays into both nostrils daily. 10/01/22   Brunetta Jeans, PA-C      Allergies    Patient has no known allergies.    Review of Systems   Review of Systems  Cardiovascular:  Positive for chest pain.  Neurological:  Positive for headaches.  All other systems reviewed and are negative.   Physical Exam Updated Vital Signs BP 106/73   Pulse 96   Temp 98.4 F (36.9 C)   Resp 16   Wt 81.6 kg   SpO2 98%   BMI 29.95 kg/m  Physical Exam Vitals and nursing note reviewed.   32 year old female, resting comfortably and in no acute distress. Vital signs are normal. Oxygen saturation is 98%,  which is normal. Head is normocephalic and atraumatic. PERRLA, EOMI. Oropharynx is clear.  There is no sinus tenderness. Neck is nontender and supple without adenopathy or JVD. Back is nontender and there is no CVA tenderness. Lungs are clear without rales, wheezes, or rhonchi.  There is a slightly prolonged exhalation phase. Chest is nontender. Heart has regular rate and rhythm without murmur. Abdomen is soft, flat, nontender. Extremities have no cyanosis or edema, full range of motion is present. Skin is warm and dry without rash. Neurologic: Mental status is normal, cranial nerves are intact, moves all extremities equally.  ED Results / Procedures / Treatments   Labs (all labs ordered are listed, but only abnormal results are displayed) Labs Reviewed  BASIC METABOLIC PANEL - Abnormal; Notable for the following components:      Result Value   Sodium 133 (*)    Potassium 3.4 (*)    Calcium 8.7 (*)    All other components within normal limits  CBC - Abnormal; Notable for the following components:   Hemoglobin 11.6 (*)    MCV 77.7 (*)    MCH 23.9 (*)    All other components within normal limits  RESP PANEL BY RT-PCR (RSV, FLU A&B, COVID)  RVPGX2  I-STAT BETA HCG BLOOD, ED ([facility], [facility], [facility] ONLY)  TROPONIN I (HIGH SENSITIVITY)  TROPONIN  I (HIGH SENSITIVITY)    EKG EKG Interpretation  Date/Time:  Sunday November 02 2022 20:47:08 EST Ventricular Rate:  109 PR Interval:  160 QRS Duration: 82 QT Interval:  308 QTC Calculation: 414 R Axis:   13 Text Interpretation: Sinus tachycardia Otherwise normal ECG No previous ECGs available Confirmed by Delora Fuel ([id number]) on 11/03/2022 12:25:53 AM  Radiology DG Chest 2 View  Result Date: 11/02/2022 CLINICAL DATA:  Chest pain and shortness of breath EXAM: CHEST - 2 VIEW COMPARISON:  None Available. FINDINGS: The heart size and mediastinal contours are within normal limits. Both lungs are clear. The visualized skeletal structures are  unremarkable. IMPRESSION: No active cardiopulmonary disease. Electronically Signed   By: Ronney Asters M.D.   On: 11/02/2022 21:34    Procedures Procedures    Medications Ordered in ED Medications  ipratropium-albuterol (DUONEB) 0.5-2.5 (3) MG/3ML nebulizer solution 3 mL (has no administration in time range)    ED Course/ Medical Decision Making/ A&P Clinical Course as of 11/03/22 0128  Nancy Fetter Nov 02, 2022  2310 Resp(!): 22 [WF]    Clinical Course User Index [WF] Tedd Sias, [place]                             Medical Decision Making Amount and/or Complexity of Data Reviewed Labs: ordered. Radiology: ordered.  Risk Prescription drug management.   Chest tightness and cough and congestion most consistent with viral illness, consider pneumonia.  Viral illnesses to consider include, but are not limited to, COVID-19, influenza, RSV.  I am also concerned about her noncompliance with her blood pressure medication regimen that will need to be addressed by her primary care provider.  Chest x-ray here shows no evidence of pneumonia.  I have independently viewed the images, and agree with the radiologist's interpretation.  I have reviewed and interpreted her laboratory tests, and my interpretation is mild hypokalemia and mild hyponatremia, anemia which is improved compared with recent values.  I have ordered a dose of oral potassium.  I have ordered a therapeutic trial of a nebulizer treatment with albuterol and ipratropium.  Patient noted no clinical improvement with albuterol and ipratropium.  I have ordered a single dose of dexamethasone and I am discharging the patient with instructions to use over-the-counter cough and cold medications as needed.  Return precautions discussed.  I have also encouraged the patient to take her medication as prescribed and not make changes without consulting her primary care provider.  I have advised her to check her blood pressure every day and keep a record of  it and take that record with her whenever she sees a medical provider.  Final Clinical Impression(s) / ED Diagnoses Final diagnoses:  Viral URI with cough  Hypokalemia  Hyponatremia    Rx / DC Orders ED Discharge Orders     None         Delora Fuel, MD 62/83/15 [phone]

## 2022-11-03 NOTE — Discharge Instructions (Signed)
Drink plenty of fluids.  You may use over-the-counter cough and cold medications as needed.  Return if symptoms are getting worse.  Please take your blood pressure medication every day, as prescribed.  You may discuss with your primary care provider whether it is advisable to stop taking it or switch to another blood pressure medication, but never make those decisions on your own.  Please check your blood pressure once a day and keep a record of it.  You should take that record with you whenever you see your medical providers.

## 2023-09-15 ENCOUNTER — Emergency Department (HOSPITAL_BASED_OUTPATIENT_CLINIC_OR_DEPARTMENT_OTHER)
Admission: EM | Admit: 2023-09-15 | Discharge: 2023-09-15 | Disposition: A | Discharge status: Home / Self Care | Payer: Medicaid Other | Attending: Emergency Medicine | Admitting: Emergency Medicine

## 2023-09-15 ENCOUNTER — Other Ambulatory Visit: Payer: Self-pay

## 2023-09-15 DIAGNOSIS — H9209 Otalgia, unspecified ear: Secondary | ICD-10-CM | POA: Insufficient documentation

## 2023-09-15 DIAGNOSIS — J069 Acute upper respiratory infection, unspecified: Secondary | ICD-10-CM | POA: Diagnosis not present

## 2023-09-15 DIAGNOSIS — Z20822 Contact with and (suspected) exposure to covid-19: Secondary | ICD-10-CM | POA: Diagnosis not present

## 2023-09-15 DIAGNOSIS — I10 Essential (primary) hypertension: Secondary | ICD-10-CM | POA: Diagnosis not present

## 2023-09-15 DIAGNOSIS — R059 Cough, unspecified: Secondary | ICD-10-CM | POA: Diagnosis present

## 2023-09-15 LAB — GROUP A STREP BY PCR: Group A Strep by PCR: NOT DETECTED

## 2023-09-15 LAB — RESP PANEL BY RT-PCR (RSV, FLU A&B, COVID)  RVPGX2
Influenza A by PCR: NEGATIVE
Influenza B by PCR: NEGATIVE
Resp Syncytial Virus by PCR: NEGATIVE
SARS Coronavirus 2 by RT PCR: NEGATIVE

## 2023-09-15 NOTE — Discharge Instructions (Signed)
You have been seen today for your complaint of upper respiratory infection. Your lab work was negative for flu, COVID, RSV, strep. Your discharge medications include Claritin and Flonase.  I recommend a 12-hour Claritin so that you may take this twice per day.  Flonase is an intranasal steroid spray.  Apply 1 spray in each nostril once daily. Home care instructions are as follows:  Eat and drink plenty of water.  Take Tylenol and ibuprofen for fevers and bodyaches. Follow up with: Primary care provider in 1 week for reevaluation Please seek immediate medical care if you develop any of the following symptoms: You have shortness of breath that gets worse. You have severe or persistent: Headache. Ear pain. Sinus pain. Chest pain. You have chronic lung disease along with any of the following: Making high-pitched whistling sounds when you breathe, most often when you breathe out (wheezing). Prolonged cough (more than 14 days). Coughing up blood. A change in your usual mucus. You have a stiff neck. You have changes in your: Vision. Hearing. Thinking. Mood. At this time there does not appear to be the presence of an emergent medical condition, however there is always the potential for conditions to change. Please read and follow the below instructions.  Do not take your medicine if  develop an itchy rash, swelling in your mouth or lips, or difficulty breathing; call 911 and seek immediate emergency medical attention if this occurs.  You may review your lab tests and imaging results in their entirety on your MyChart account.  Please discuss all results of fully with your primary care provider and other specialist at your follow-up visit.  Note: Portions of this text may have been transcribed using voice recognition software. Every effort was made to ensure accuracy; however, inadvertent computerized transcription errors may still be present.

## 2023-09-15 NOTE — ED Notes (Signed)
Writer went to discharge patient. Pt is not in room. Patient can not be located in department. Writer unable to go through discharge paperwork prior to patient's departure.

## 2023-09-15 NOTE — ED Triage Notes (Signed)
Pt POV from home reporting cold/flu sx since Sunday. Sore throat began yesterday. No fevers, at home covid test neg.

## 2023-09-15 NOTE — ED Provider Notes (Signed)
[facility] EMERGENCY DEPARTMENT AT [facility] Provider Note   CSN: [id number] Arrival date & time: 09/15/23  1905     History  No chief complaint on file.   Lajla Hilbun is a 32 y.o. female.  With a history of hypertension presenting to the ED for evaluation of cough, congestion, rhinorrhea, otalgia, headaches.  Symptoms have been present for the past 2 days.  She reports she was at a family Thanksgiving last week and later found out that 3 close contacts have been sick.  She denies any fevers but reports some chills.  No shortness of breath or chest pain.  No abdominal pain, nausea or vomiting.  She has been taking Mucinex DM for her symptoms with some relief.  HPI     Home Medications Prior to Admission medications   Medication Sig Start Date End Date Taking? Authorizing Provider  benzonatate (TESSALON) 100 MG capsule Take 1 capsule (100 mg total) by mouth 3 (three) times daily as needed for cough. 10/01/22   Waldon Merl, PA-C  fluticasone (FLONASE) 50 MCG/ACT nasal spray Place 2 sprays into both nostrils daily. 10/01/22   Waldon Merl, PA-C      Allergies    Patient has no known allergies.    Review of Systems   Review of Systems  HENT:  Positive for congestion, rhinorrhea and sore throat.   Neurological:  Positive for headaches.  All other systems reviewed and are negative.   Physical Exam Updated Vital Signs BP (!) 146/103   Pulse 91   Temp 98.4 F (36.9 C) (Oral)   Resp 18   Ht 5\' 5"  (1.651 m)   Wt 93.4 kg   LMP 09/10/2023 (Exact Date)   SpO2 99%   Breastfeeding No   BMI 34.28 kg/m  Physical Exam Vitals and nursing note reviewed.  Constitutional:      General: She is not in acute distress.    Appearance: She is well-developed. She is not ill-appearing, toxic-appearing or diaphoretic.     Comments: Resting comfortably in chair  HENT:     Head: Normocephalic and atraumatic.     Nose: Congestion and rhinorrhea present.      Mouth/Throat:     Comments: Uvula midline.  Tonsils 1+ bilaterally.  No exudates.  Moderate posterior pharyngeal erythema Eyes:     Conjunctiva/sclera: Conjunctivae normal.  Cardiovascular:     Rate and Rhythm: Normal rate and regular rhythm.     Heart sounds: No murmur heard. Pulmonary:     Effort: Pulmonary effort is normal. No respiratory distress.     Breath sounds: Normal breath sounds. No wheezing, rhonchi or rales.  Abdominal:     Palpations: Abdomen is soft.     Tenderness: There is no abdominal tenderness.  Musculoskeletal:        General: No swelling.     Cervical back: Neck supple.  Skin:    General: Skin is warm and dry.     Capillary Refill: Capillary refill takes less than 2 seconds.  Neurological:     Mental Status: She is alert.  Psychiatric:        Mood and Affect: Mood normal.     ED Results / Procedures / Treatments   Labs (all labs ordered are listed, but only abnormal results are displayed) Labs Reviewed  RESP PANEL BY RT-PCR (RSV, FLU A&B, COVID)  RVPGX2  GROUP A STREP BY PCR    EKG None  Radiology No results found.  Procedures Procedures  Medications Ordered in ED Medications - No data to display  ED Course/ Medical Decision Making/ A&P                                 Medical Decision Making This patient presents to the ED for concern of URI symptoms, this involves an extensive number of treatment options, and is a complaint that carries with it a high risk of complications and morbidity.  The differential diagnosis includes flu, COVID, other viral URI, pneumonia, mononucleosis  My initial workup includes respiratory panel, rapid strep  Additional history obtained from: Nursing notes from this visit.  I ordered, reviewed and interpreted labs which include: Respiratory, rapid strep.  Negative.  Afebrile, hypertensive but otherwise hemodynamically stable.  32 year old female presenting for evaluation of constellation of symptoms  consistent with a viral upper respiratory infection.  She does have confirmed sick contacts.  Symptoms have been present for 2 days.  She appears fairly well on physical exam.  She does have some congestion and rhinorrhea as well as posterior pharyngeal erythema.  No adventitious breath sounds.  Respiratory panel and rapid strep negative.  Suspect some other viral process.  She was educated on supportive care at home.  She is encouraged to refrain from Sudafed use given her hypertension.  She was educated on typical timeline of their symptoms.  She was encouraged to follow-up in 1 week for reevaluation.  Stable at discharge.  At this time there does not appear to be any evidence of an acute emergency medical condition and the patient appears stable for discharge with appropriate outpatient follow up. Diagnosis was discussed with patient who verbalizes understanding of care plan and is agreeable to discharge. I have discussed return precautions with patient who verbalizes understanding. Patient encouraged to follow-up with their PCP within 1 week. All questions answered.  Note: Portions of this report may have been transcribed using voice recognition software. Every effort was made to ensure accuracy; however, inadvertent computerized transcription errors may still be present.         Final Clinical Impression(s) / ED Diagnoses Final diagnoses:  Viral URI with cough    Rx / DC Orders ED Discharge Orders     None         Mora Bellman 09/15/23 2057    Tegeler, Canary Brim, MD 09/15/23 [phone]

## 2023-12-04 ENCOUNTER — Encounter (HOSPITAL_BASED_OUTPATIENT_CLINIC_OR_DEPARTMENT_OTHER): Payer: Self-pay | Admitting: Emergency Medicine

## 2023-12-04 ENCOUNTER — Emergency Department (HOSPITAL_BASED_OUTPATIENT_CLINIC_OR_DEPARTMENT_OTHER): Payer: Medicaid Other

## 2023-12-04 ENCOUNTER — Emergency Department (HOSPITAL_BASED_OUTPATIENT_CLINIC_OR_DEPARTMENT_OTHER)
Admission: EM | Admit: 2023-12-04 | Discharge: 2023-12-04 | Disposition: A | Discharge status: Home / Self Care | Payer: Medicaid Other | Attending: Emergency Medicine | Admitting: Emergency Medicine

## 2023-12-04 ENCOUNTER — Other Ambulatory Visit (HOSPITAL_BASED_OUTPATIENT_CLINIC_OR_DEPARTMENT_OTHER): Payer: Self-pay

## 2023-12-04 ENCOUNTER — Other Ambulatory Visit: Payer: Self-pay

## 2023-12-04 DIAGNOSIS — R1032 Left lower quadrant pain: Secondary | ICD-10-CM | POA: Diagnosis present

## 2023-12-04 DIAGNOSIS — N83202 Unspecified ovarian cyst, left side: Secondary | ICD-10-CM | POA: Diagnosis not present

## 2023-12-04 DIAGNOSIS — Z87891 Personal history of nicotine dependence: Secondary | ICD-10-CM | POA: Insufficient documentation

## 2023-12-04 DIAGNOSIS — I10 Essential (primary) hypertension: Secondary | ICD-10-CM | POA: Insufficient documentation

## 2023-12-04 LAB — URINALYSIS, ROUTINE W REFLEX MICROSCOPIC
Bilirubin Urine: NEGATIVE
Glucose, UA: NEGATIVE mg/dL
Hgb urine dipstick: NEGATIVE
Ketones, ur: NEGATIVE mg/dL
Leukocytes,Ua: NEGATIVE
Nitrite: NEGATIVE
Protein, ur: NEGATIVE mg/dL
Specific Gravity, Urine: 1.022 (ref 1.005–1.030)
pH: 7.5 (ref 5.0–8.0)

## 2023-12-04 LAB — CBC
HCT: 38.1 % (ref 36.0–46.0)
Hemoglobin: 11.8 g/dL — ABNORMAL LOW (ref 12.0–15.0)
MCH: 24 pg — ABNORMAL LOW (ref 26.0–34.0)
MCHC: 31 g/dL (ref 30.0–36.0)
MCV: 77.6 fL — ABNORMAL LOW (ref 80.0–100.0)
Platelets: 302 10*3/uL (ref 150–400)
RBC: 4.91 MIL/uL (ref 3.87–5.11)
RDW: 15.3 % (ref 11.5–15.5)
WBC: 10.8 10*3/uL — ABNORMAL HIGH (ref 4.0–10.5)
nRBC: 0 % (ref 0.0–0.2)

## 2023-12-04 LAB — COMPREHENSIVE METABOLIC PANEL
ALT: 12 U/L (ref 0–44)
AST: 16 U/L (ref 15–41)
Albumin: 4.3 g/dL (ref 3.5–5.0)
Alkaline Phosphatase: 91 U/L (ref 38–126)
Anion gap: 7 (ref 5–15)
BUN: 10 mg/dL (ref 6–20)
CO2: 25 mmol/L (ref 22–32)
Calcium: 9.4 mg/dL (ref 8.9–10.3)
Chloride: 104 mmol/L (ref 98–111)
Creatinine, Ser: 0.62 mg/dL (ref 0.44–1.00)
GFR, Estimated: >60 mL/min (ref >60)
Glucose, Bld: 109 mg/dL — ABNORMAL HIGH (ref 70–99)
Potassium: 4.2 mmol/L (ref 3.5–5.1)
Sodium: 136 mmol/L (ref 135–145)
Total Bilirubin: 0.3 mg/dL (ref 0.0–1.2)
Total Protein: 8.3 g/dL — ABNORMAL HIGH (ref 6.5–8.1)

## 2023-12-04 LAB — LIPASE, BLOOD: Lipase: 16 U/L (ref 11–51)

## 2023-12-04 LAB — PREGNANCY, URINE: Preg Test, Ur: NEGATIVE

## 2023-12-04 MED ORDER — KETOROLAC TROMETHAMINE 10 MG PO TABS
10.0000 mg | ORAL_TABLET | Freq: Four times a day (QID) | ORAL | 0 refills | Status: AC | PRN
Start: 2023-12-04 — End: ?

## 2023-12-04 MED ORDER — ONDANSETRON 4 MG PO TBDP: 4.0000 mg | ORAL_TABLET | Freq: Three times a day (TID) | ORAL | 0 refills | Status: AC | PRN

## 2023-12-04 MED ORDER — ONDANSETRON 4 MG PO TBDP
4.0000 mg | ORAL_TABLET | Freq: Three times a day (TID) | ORAL | 0 refills | Status: DC | PRN
  Filled 2023-12-04: qty 20, 7d supply, fill #0

## 2023-12-04 MED ORDER — KETOROLAC TROMETHAMINE 10 MG PO TABS
10.0000 mg | ORAL_TABLET | Freq: Four times a day (QID) | ORAL | 0 refills | Status: DC | PRN
  Filled 2023-12-04: qty 20, 5d supply, fill #0

## 2023-12-04 MED ORDER — ONDANSETRON HCL 4 MG/2ML IJ SOLN
4.0000 mg | Freq: Once | INTRAMUSCULAR | Status: AC
  Administered 2023-12-04: 4 mg via INTRAVENOUS
  Filled 2023-12-04: qty 2

## 2023-12-04 MED ORDER — IOHEXOL 300 MG/ML  SOLN
100.0000 mL | Freq: Once | INTRAMUSCULAR | Status: AC | PRN
  Administered 2023-12-04: 100 mL via INTRAVENOUS

## 2023-12-04 MED ORDER — FENTANYL CITRATE PF 50 MCG/ML IJ SOSY
25.0000 ug | PREFILLED_SYRINGE | Freq: Once | INTRAMUSCULAR | Status: AC
  Administered 2023-12-04: 25 ug via INTRAVENOUS
  Filled 2023-12-04: qty 1

## 2023-12-04 MED ORDER — MORPHINE SULFATE (PF) 4 MG/ML IV SOLN
4.0000 mg | Freq: Once | INTRAVENOUS | Status: AC
  Administered 2023-12-04: 4 mg via INTRAVENOUS
  Filled 2023-12-04: qty 1

## 2023-12-04 NOTE — ED Notes (Signed)
 Patient updated on status, resting comfortably, family at bedside.

## 2023-12-04 NOTE — ED Notes (Signed)
 Patient discharged to home after review of written discharge instructions, rx meds reviewed and all questions answered to patient's satisfaction.  INT removed intact, pressure dressing applied and patient discharged alert, oriented and self-ambulatory from ED.  Accompanied by family members

## 2023-12-04 NOTE — ED Notes (Signed)
 Patient transported to CT

## 2023-12-04 NOTE — ED Notes (Signed)
 Patient transported to Ultrasound

## 2023-12-04 NOTE — ED Notes (Signed)
Returned form US.

## 2023-12-04 NOTE — ED Notes (Signed)
 Pain reports pain increased and is requesting pain medication.  Provider made aware

## 2023-12-04 NOTE — Discharge Instructions (Addendum)
 As discussed, ultrasound did show evidence of hemorrhagic cyst within the left ovary.  Suspect this is most likely causing her symptoms.  The cyst does not look complicated but will resolve with time.  Recommendation is for repeat imaging in about 6 weeks.  Otherwise, your CT scan as well as pelvic ultrasound appeared normal.  Will send you home with medicine to use as needed for nausea as well as medicine to use as needed for pain.  Recommend follow-up with your OB/GYN in the outpatient setting for reassessment.  Please do not hesitate to return to the emergency department if the worrisome signs and symptoms we discussed become apparent.

## 2023-12-04 NOTE — ED Provider Notes (Signed)
  EMERGENCY DEPARTMENT AT [facility] Provider Note   CSN: [id number] Arrival date & time: 12/04/23  1108     History  Chief Complaint  Patient presents with   Abdominal Pain    Christina May is a 33 y.o. female.   Abdominal Pain   33 year old female presents emergency department complaints of left lower abdominal pain.  States that symptoms been present since last night.  States it feels similar to menstrual pain that she has had in the past but much more severe.  Reports history of tubal ligation and states that she just ended her menstrual cycle on 11/21/2023.  Does report nausea with no emesis.  Denies any fever, chills, chest pain, shortness of breath, urinary symptoms, vaginal symptoms, change in bowel habits.  Denies any other abdominal surgeries.  Past medical history significant for fibroid, hypertension  Home Medications Prior to Admission medications   Medication Sig Start Date End Date Taking? Authorizing Provider  benzonatate (TESSALON) 100 MG capsule Take 1 capsule (100 mg total) by mouth 3 (three) times daily as needed for cough. 10/01/22   Waldon Merl, PA-C  fluticasone (FLONASE) 50 MCG/ACT nasal spray Place 2 sprays into both nostrils daily. 10/01/22   Waldon Merl, PA-C  ketorolac (TORADOL) 10 MG tablet Take 1 tablet (10 mg total) by mouth every 6 (six) hours as needed. 12/04/23   Sherian Maroon A, PA  ondansetron (ZOFRAN-ODT) 4 MG disintegrating tablet Take 1 tablet (4 mg total) by mouth every 8 (eight) hours as needed. 12/04/23   Peter Garter, PA      Allergies    Patient has no known allergies.    Review of Systems   Review of Systems  Gastrointestinal:  Positive for abdominal pain.  All other systems reviewed and are negative.   Physical Exam Updated Vital Signs BP 101/64   Pulse 88   Temp 98 F (36.7 C) (Oral)   Resp 13   Ht 5\' 5"  (1.651 m)   Wt 93.4 kg   LMP 11/21/2023 (Approximate)   SpO2 100%   BMI 34.27  kg/m  Physical Exam Vitals and nursing note reviewed.  Constitutional:      General: She is not in acute distress.    Appearance: She is well-developed.  HENT:     Head: Normocephalic and atraumatic.  Eyes:     Conjunctiva/sclera: Conjunctivae normal.  Cardiovascular:     Rate and Rhythm: Normal rate and regular rhythm.     Heart sounds: No murmur heard. Pulmonary:     Effort: Pulmonary effort is normal. No respiratory distress.     Breath sounds: Normal breath sounds.  Abdominal:     Palpations: Abdomen is soft.     Tenderness: There is abdominal tenderness in the left lower quadrant. There is no right CVA tenderness or left CVA tenderness.  Musculoskeletal:        General: No swelling.     Cervical back: Neck supple.  Skin:    General: Skin is warm and dry.     Capillary Refill: Capillary refill takes less than 2 seconds.  Neurological:     Mental Status: She is alert.  Psychiatric:        Mood and Affect: Mood normal.     ED Results / Procedures / Treatments   Labs (all labs ordered are listed, but only abnormal results are displayed) Labs Reviewed  COMPREHENSIVE METABOLIC PANEL - Abnormal; Notable for the following components:  Result Value   Glucose, Bld 109 (*)    Total Protein 8.3 (*)    All other components within normal limits  CBC - Abnormal; Notable for the following components:   WBC 10.8 (*)    Hemoglobin 11.8 (*)    MCV 77.6 (*)    MCH 24.0 (*)    All other components within normal limits  LIPASE, BLOOD  URINALYSIS, ROUTINE W REFLEX MICROSCOPIC  PREGNANCY, URINE    EKG EKG Interpretation Date/Time:  Friday December 04 2023 11:45:02 EST Ventricular Rate:  83 PR Interval:  172 QRS Duration:  81 QT Interval:  359 QTC Calculation: 422 R Axis:   57  Text Interpretation: Sinus or ectopic atrial rhythm Interpretation limited secondary to artifact Confirmed by Vanetta Mulders [phone]) on 12/04/2023 4:03:04 PM  Radiology US Transvaginal  Non-OB Result Date: 12/04/2023 CLINICAL DATA:  Two day history of left lower quadrant pain. Suspected hydrosalpinx versus pyosalpinx on same day CT. EXAM: ULTRASOUND PELVIS TRANSVAGINAL TECHNIQUE: Transvaginal ultrasound examination of the pelvis was performed including evaluation of the uterus, ovaries, adnexal regions, and pelvic cul-de-sac. COMPARISON:  Same day CT abdomen and pelvis FINDINGS: Uterus Measurements: 9.6 cm in sagittal dimension. No fibroids or other mass visualized. Endometrium Thickness: 12 mm.  No focal abnormality visualized. Right ovary Measurements: 3.6 x 2.1 x 1.8 cm = volume: 7.4 mL. Normal appearance. No adnexal mass. Left ovary Measurements: 4.0 x 2.6 x 2.5 cm = volume: 13.4 mL. Heterogeneously echogenic, avascular structure within the left ovary measures 1.8 x 1.3 x 1.2 cm. Other findings: Small volume pelvic free fluid. Prominent bilateral adnexal vasculature. IMPRESSION: 1. Heterogeneously echogenic, avascular structure within the left ovary measures 1.8 x 1.3 x 1.2 cm, which may represent a hemorrhagic cyst. Recommend follow-up pelvic ultrasound examination in 6 weeks to ensure resolution. 2. Prominent bilateral adnexal vasculature. No sonographic finding of hydro or pyosalpinx. Electronically Signed   By: Agustin Cree M.D.   On: 12/04/2023 17:21   CT ABDOMEN PELVIS W CONTRAST Result Date: 12/04/2023 CLINICAL DATA:  w left lower quadrant abdominal pain EXAM: CT ABDOMEN AND PELVIS WITH CONTRAST TECHNIQUE: Multidetector CT imaging of the abdomen and pelvis was performed using the standard protocol following bolus administration of intravenous contrast. RADIATION DOSE REDUCTION: This exam was performed according to the departmental dose-optimization program which includes automated exposure control, adjustment of the mA and/or kV according to patient size and/or use of iterative reconstruction technique. CONTRAST:  OMNIPAQUE IOHEXOL 300 MG/ML  SOLN COMPARISON:  CT abdomen and  pelvis dated 07/20/2005 FINDINGS: Lower chest: No focal consolidation or pulmonary nodule in the lung bases. No pleural effusion or pneumothorax demonstrated. Partially imaged heart size is normal. Hepatobiliary: No focal hepatic lesions. No intra or extrahepatic biliary ductal dilation. Normal gallbladder. Pancreas: No focal lesions or main ductal dilation. Spleen: Normal in size without focal abnormality. Adrenals/Urinary Tract: No adrenal nodules. No suspicious renal mass, calculi or hydronephrosis. No focal bladder wall thickening. Stomach/Bowel: Normal appearance of the stomach. The splenic flexure is not entirely included within the field of view. No evidence of bowel wall thickening, distention, or inflammatory changes. Appendix is not discretely seen. Vascular/Lymphatic: No significant vascular findings are present. No enlarged abdominal or pelvic lymph nodes. Reproductive: Hypoattenuating, tubular structure in the left adnexa (2:65). Bilateral adnexal surgical clips. Other: Small volume right lower quadrant and pelvic pelvic free fluid. Trace mesenteric stranding. No free air or fluid collection. Musculoskeletal: No acute or abnormal lytic or blastic osseous lesions. Small fat containing  right inguinal hernia. IMPRESSION: 1. Hypoattenuating, tubular structure in the left adnexa, which may represent hydrosalpinx or pyosalpinx. Of note, earlier same day pelvic ultrasound examination was performed transabdominally. Recommend correlation with physical examination and consider further evaluation with transvaginal pelvic ultrasound examination. 2. Small volume free fluid may be reactive. Electronically Signed   By: Agustin Cree M.D.   On: 12/04/2023 15:54   US Pelvis Complete Result Date: 12/04/2023 CLINICAL DATA:  pain.  Pelvic pain for 1 day. EXAM: TRANSABDOMINAL ULTRASOUND OF PELVIS DOPPLER ULTRASOUND OF OVARIES TECHNIQUE: Transabdominal ultrasound examination of the pelvis was performed including evaluation  of the uterus, ovaries, adnexal regions, and pelvic cul-de-sac. Color and duplex Doppler ultrasound was utilized to evaluate blood flow to the ovaries. COMPARISON:  None Available. FINDINGS: Uterus Measurements: 4.8 x 8.1 x 8.9 cm. = volume: 182.7 mL. No fibroids or other mass visualized. Endometrium Thickness: 15.4 mm.  No focal abnormality visualized. Right ovary Measurements: 1.9 x 2.6 x 4.8 cm = volume: 12.2 mL. Normal appearance/no adnexal mass. Left ovary Measurements: 0.6 x 2.6 x 3.2 cm. = volume: 11.2 mL. Normal appearance/no adnexal mass. Pulsed Doppler evaluation demonstrates normal low-resistance arterial and venous waveforms in both ovaries. Other: None. IMPRESSION: *Unremarkable pelvic transabdominal ultrasound and Doppler exam. Electronically Signed   By: Jules Schick M.D.   On: 12/04/2023 14:22   [place] Art/Ven Flow Abd Pelv Doppler Result Date: 12/04/2023 CLINICAL DATA:  pain.  Pelvic pain for 1 day. EXAM: TRANSABDOMINAL ULTRASOUND OF PELVIS DOPPLER ULTRASOUND OF OVARIES TECHNIQUE: Transabdominal ultrasound examination of the pelvis was performed including evaluation of the uterus, ovaries, adnexal regions, and pelvic cul-de-sac. Color and duplex Doppler ultrasound was utilized to evaluate blood flow to the ovaries. COMPARISON:  None Available. FINDINGS: Uterus Measurements: 4.8 x 8.1 x 8.9 cm. = volume: 182.7 mL. No fibroids or other mass visualized. Endometrium Thickness: 15.4 mm.  No focal abnormality visualized. Right ovary Measurements: 1.9 x 2.6 x 4.8 cm = volume: 12.2 mL. Normal appearance/no adnexal mass. Left ovary Measurements: 0.6 x 2.6 x 3.2 cm. = volume: 11.2 mL. Normal appearance/no adnexal mass. Pulsed Doppler evaluation demonstrates normal low-resistance arterial and venous waveforms in both ovaries. Other: None. IMPRESSION: *Unremarkable pelvic transabdominal ultrasound and Doppler exam. Electronically Signed   By: Jules Schick M.D.   On: 12/04/2023 14:22     Procedures Procedures    Medications Ordered in ED Medications  morphine (PF) 4 MG/ML injection 4 mg (4 mg Intravenous Given 12/04/23 1223)  ondansetron (ZOFRAN) injection 4 mg (4 mg Intravenous Given 12/04/23 1223)  iohexol (OMNIPAQUE) 300 MG/ML solution 100 mL (100 mLs Intravenous Contrast Given 12/04/23 1437)  fentaNYL (SUBLIMAZE) injection 25 mcg (25 mcg Intravenous Given 12/04/23 1554)    ED Course/ Medical Decision Making/ A&P                                 Medical Decision Making Amount and/or Complexity of Data Reviewed Labs: ordered. Radiology: ordered.  Risk Prescription drug management.   This patient presents to the ED for concern of abdominal pain, this involves an extensive number of treatment options, and is a complaint that carries with it a high risk of complications and morbidity.  The differential diagnosis includes ectopic pregnancy, tubo-ovarian abscess, ovarian torsion, pyelonephritis, nephrolithiasis, diverticulitis, appendicitis, cystitis, other   Co morbidities that complicate the patient evaluation  See HPI   Additional history obtained:  Additional history obtained from EMR External  records from outside source obtained and reviewed including hospital records   Lab Tests:  I Ordered, and personally interpreted labs.  The pertinent results include: Leukocytosis of 10.8.  Anemia with a hemoglobin 11.8 which is microcytic in nature.  Platelets within range.  No Electra abnormalities.  No transaminitis.  No renal dysfunction.  Urine pregnancy negative.  UA without abnormality.  Lipase within normal limits.   Imaging Studies ordered:  I ordered imaging studies including pelvic ultrasound, CT abdomen pelvis I independently visualized and interpreted imaging which showed  CT abdomen pelvis: Hypoattenuating tubular structure within the left adnexa Pelvic ultrasound: Heterogeneously echogenic avascular structure within left ovary concerning for  hemorrhagic cyst.  Prominent bilateral adnexal vasculature. I agree with the radiologist interpretation  Cardiac Monitoring: / EKG:  The patient was maintained on a cardiac monitor.  I personally viewed and interpreted the cardiac monitored which showed an underlying rhythm of: Sinus rhythm   Consultations Obtained:  N/a   Problem List / ED Course / Critical interventions / Medication management  Left ovarian cyst I ordered medication including Zofran, morphine, fentanyl   Reevaluation of the patient after these medicines showed that the patient improved I have reviewed the patients home medicines and have made adjustments as needed   Social Determinants of Health:  Former cigarette use.  Denies illicit drug use.   Test / Admission - Considered:  Left ovarian cyst Vitals signs within normal range and stable throughout visit. Laboratory/imaging studies significant for: See above 33 year old female presents emergency department with complaints of left lower abdominal pain.  Symptom onset last night.  On exam, tenderness left lower abdomen/left adnexal region.  Laboratory studies concerning for slight leukocytosis of 10.8 but otherwise, reassuring.  Imaging studies concerning for left-sided hemorrhagic cyst but otherwise unremarkable for any acute process.  No evidence of ovarian torsion.  Suspect patient's symptoms are likely secondary to left-sided ovarian cyst.  Will treat patient's symptoms with NSAIDs, nausea medicine to use as needed.  Recommend follow-up with OB/GYN in outpatient setting.  Treatment plan discussed length with patient and she acknowledged understanding was agreeable to said plan.  Patient overall well-appearing, afebrile in no acute distress. Worrisome signs and symptoms were discussed with the patient, and the patient acknowledged understanding to return to the ED if noticed. Patient was stable upon discharge.  '        Final Clinical Impression(s) / ED  Diagnoses Final diagnoses:  Left ovarian cyst    Rx / DC Orders ED Discharge Orders          Ordered    ketorolac (TORADOL) 10 MG tablet  Every 6 hours PRN,   Status:  Discontinued        12/04/23 1727    ondansetron (ZOFRAN-ODT) 4 MG disintegrating tablet  Every 8 hours PRN,   Status:  Discontinued        12/04/23 1727    ketorolac (TORADOL) 10 MG tablet  Every 6 hours PRN        12/04/23 1756    ondansetron (ZOFRAN-ODT) 4 MG disintegrating tablet  Every 8 hours PRN        12/04/23 1756              Peter Garter, PA 12/04/23 1802    Anders Simmonds T, DO 12/06/23 [phone]

## 2023-12-04 NOTE — ED Triage Notes (Signed)
 Pt via pov from home with left sided abdominal pain since last night. Pt reports that she just finished her menstrual cycle and has had tubal ligation as  well. She denies hx of ovarian cyst. Pt states that the pain has made her nauseous, denies emesis. Pt alert & oriented, tearful during triage.

## 2023-12-07 ENCOUNTER — Other Ambulatory Visit (HOSPITAL_BASED_OUTPATIENT_CLINIC_OR_DEPARTMENT_OTHER): Payer: Self-pay
